# Patient Record
Sex: Female | Born: 1955 | Race: White | Hispanic: No | Smoking: Former smoker
Health system: Southern US, Community
[De-identification: ages and names within clinical notes are randomized; demographics above are authoritative.]

## PROBLEM LIST (undated history)

## (undated) DIAGNOSIS — D649 Anemia, unspecified: Secondary | ICD-10-CM

## (undated) DIAGNOSIS — E119 Type 2 diabetes mellitus without complications: Secondary | ICD-10-CM

## (undated) DIAGNOSIS — M869 Osteomyelitis, unspecified: Secondary | ICD-10-CM

## (undated) HISTORY — DX: Osteomyelitis, unspecified: M86.9

## (undated) HISTORY — DX: Anemia, unspecified: D64.9

## (undated) HISTORY — DX: Type 2 diabetes mellitus without complications: E11.9

## (undated) HISTORY — PX: OTHER SURGICAL HISTORY: SHX169

## (undated) HISTORY — PX: SKIN GRAFT: SHX250

---

## 2016-12-11 ENCOUNTER — Emergency Department (HOSPITAL_COMMUNITY): Payer: BLUE CROSS/BLUE SHIELD

## 2016-12-11 ENCOUNTER — Inpatient Hospital Stay (HOSPITAL_COMMUNITY)
Admission: EM | Admit: 2016-12-11 | Discharge: 2016-12-17 | DRG: 300 | Disposition: A | Discharge status: Home / Self Care | Payer: BLUE CROSS/BLUE SHIELD | Attending: Internal Medicine | Admitting: Internal Medicine

## 2016-12-11 ENCOUNTER — Encounter (HOSPITAL_COMMUNITY): Payer: Self-pay | Admitting: Family Medicine

## 2016-12-11 DIAGNOSIS — E872 Acidosis: Secondary | ICD-10-CM | POA: Diagnosis not present

## 2016-12-11 DIAGNOSIS — Z79899 Other long term (current) drug therapy: Secondary | ICD-10-CM

## 2016-12-11 DIAGNOSIS — R739 Hyperglycemia, unspecified: Secondary | ICD-10-CM | POA: Diagnosis not present

## 2016-12-11 DIAGNOSIS — Z7189 Other specified counseling: Secondary | ICD-10-CM | POA: Diagnosis not present

## 2016-12-11 DIAGNOSIS — E1169 Type 2 diabetes mellitus with other specified complication: Secondary | ICD-10-CM | POA: Diagnosis present

## 2016-12-11 DIAGNOSIS — D649 Anemia, unspecified: Secondary | ICD-10-CM | POA: Diagnosis present

## 2016-12-11 DIAGNOSIS — M869 Osteomyelitis, unspecified: Secondary | ICD-10-CM

## 2016-12-11 DIAGNOSIS — R5382 Chronic fatigue, unspecified: Secondary | ICD-10-CM | POA: Diagnosis present

## 2016-12-11 DIAGNOSIS — Z794 Long term (current) use of insulin: Secondary | ICD-10-CM | POA: Diagnosis not present

## 2016-12-11 DIAGNOSIS — Z515 Encounter for palliative care: Secondary | ICD-10-CM | POA: Diagnosis present

## 2016-12-11 DIAGNOSIS — E1152 Type 2 diabetes mellitus with diabetic peripheral angiopathy with gangrene: Principal | ICD-10-CM | POA: Diagnosis present

## 2016-12-11 DIAGNOSIS — Z87891 Personal history of nicotine dependence: Secondary | ICD-10-CM | POA: Diagnosis not present

## 2016-12-11 DIAGNOSIS — E860 Dehydration: Secondary | ICD-10-CM

## 2016-12-11 DIAGNOSIS — E871 Hypo-osmolality and hyponatremia: Secondary | ICD-10-CM

## 2016-12-11 DIAGNOSIS — E0810 Diabetes mellitus due to underlying condition with ketoacidosis without coma: Secondary | ICD-10-CM | POA: Diagnosis not present

## 2016-12-11 DIAGNOSIS — I96 Gangrene, not elsewhere classified: Secondary | ICD-10-CM | POA: Diagnosis not present

## 2016-12-11 DIAGNOSIS — E11621 Type 2 diabetes mellitus with foot ulcer: Secondary | ICD-10-CM | POA: Diagnosis present

## 2016-12-11 DIAGNOSIS — E46 Unspecified protein-calorie malnutrition: Secondary | ICD-10-CM | POA: Diagnosis present

## 2016-12-11 DIAGNOSIS — Z66 Do not resuscitate: Secondary | ICD-10-CM | POA: Diagnosis present

## 2016-12-11 DIAGNOSIS — R7881 Bacteremia: Secondary | ICD-10-CM | POA: Diagnosis not present

## 2016-12-11 DIAGNOSIS — F4321 Adjustment disorder with depressed mood: Secondary | ICD-10-CM | POA: Diagnosis present

## 2016-12-11 DIAGNOSIS — L97509 Non-pressure chronic ulcer of other part of unspecified foot with unspecified severity: Secondary | ICD-10-CM | POA: Diagnosis present

## 2016-12-11 DIAGNOSIS — E1110 Type 2 diabetes mellitus with ketoacidosis without coma: Secondary | ICD-10-CM | POA: Diagnosis present

## 2016-12-11 DIAGNOSIS — E8729 Other acidosis: Secondary | ICD-10-CM

## 2016-12-11 DIAGNOSIS — E222 Syndrome of inappropriate secretion of antidiuretic hormone: Secondary | ICD-10-CM | POA: Diagnosis present

## 2016-12-11 DIAGNOSIS — Z9109 Other allergy status, other than to drugs and biological substances: Secondary | ICD-10-CM | POA: Diagnosis not present

## 2016-12-11 DIAGNOSIS — E1165 Type 2 diabetes mellitus with hyperglycemia: Secondary | ICD-10-CM | POA: Diagnosis present

## 2016-12-11 DIAGNOSIS — M86171 Other acute osteomyelitis, right ankle and foot: Secondary | ICD-10-CM

## 2016-12-11 DIAGNOSIS — F1310 Sedative, hypnotic or anxiolytic abuse, uncomplicated: Secondary | ICD-10-CM | POA: Diagnosis not present

## 2016-12-11 LAB — COMPREHENSIVE METABOLIC PANEL
ALBUMIN: 1.8 g/dL — AB (ref 3.5–5.0)
ALK PHOS: 149 U/L — AB (ref 38–126)
ALT: 39 U/L (ref 14–54)
AST: 29 U/L (ref 15–41)
Anion gap: 16 — ABNORMAL HIGH (ref 5–15)
BUN: 18 mg/dL (ref 6–20)
CO2: 20 mmol/L — ABNORMAL LOW (ref 22–32)
CREATININE: 1.07 mg/dL — AB (ref 0.44–1.00)
Calcium: 9.2 mg/dL (ref 8.9–10.3)
Chloride: 92 mmol/L — ABNORMAL LOW (ref 101–111)
GFR calc non Af Amer: 55 mL/min — ABNORMAL LOW (ref >60)
Glucose, Bld: 305 mg/dL — ABNORMAL HIGH (ref 65–99)
Potassium: 4 mmol/L (ref 3.5–5.1)
Sodium: 128 mmol/L — ABNORMAL LOW (ref 135–145)
TOTAL PROTEIN: 7.4 g/dL (ref 6.5–8.1)
Total Bilirubin: 0.9 mg/dL (ref 0.3–1.2)

## 2016-12-11 LAB — CBC WITH DIFFERENTIAL/PLATELET
BASOS ABS: 0 10*3/uL (ref 0.0–0.1)
BASOS PCT: 0 %
EOS ABS: 0 10*3/uL (ref 0.0–0.7)
Eosinophils Relative: 0 %
HCT: 33.7 % — ABNORMAL LOW (ref 36.0–46.0)
HEMOGLOBIN: 11 g/dL — AB (ref 12.0–15.0)
Lymphocytes Relative: 11 %
Lymphs Abs: 1.8 10*3/uL (ref 0.7–4.0)
MCH: 26.8 pg (ref 26.0–34.0)
MCHC: 32.6 g/dL (ref 30.0–36.0)
MCV: 82.2 fL (ref 78.0–100.0)
Monocytes Absolute: 0.7 10*3/uL (ref 0.1–1.0)
Monocytes Relative: 4 %
Neutro Abs: 13.2 10*3/uL — ABNORMAL HIGH (ref 1.7–7.7)
Neutrophils Relative %: 85 %
Platelets: 406 10*3/uL — ABNORMAL HIGH (ref 150–400)
RBC: 4.1 MIL/uL (ref 3.87–5.11)
RDW: 14.1 % (ref 11.5–15.5)
WBC: 15.7 10*3/uL — ABNORMAL HIGH (ref 4.0–10.5)

## 2016-12-11 LAB — BASIC METABOLIC PANEL
Anion gap: 10 (ref 5–15)
Anion gap: 11 (ref 5–15)
BUN: 15 mg/dL (ref 6–20)
BUN: 15 mg/dL (ref 6–20)
CHLORIDE: 100 mmol/L — AB (ref 101–111)
CO2: 19 mmol/L — ABNORMAL LOW (ref 22–32)
CO2: 21 mmol/L — ABNORMAL LOW (ref 22–32)
CREATININE: 0.83 mg/dL (ref 0.44–1.00)
CREATININE: 0.83 mg/dL (ref 0.44–1.00)
Calcium: 8.3 mg/dL — ABNORMAL LOW (ref 8.9–10.3)
Calcium: 8.5 mg/dL — ABNORMAL LOW (ref 8.9–10.3)
Chloride: 100 mmol/L — ABNORMAL LOW (ref 101–111)
GFR calc Af Amer: >60 mL/min (ref >60)
GFR calc Af Amer: >60 mL/min (ref >60)
GFR calc non Af Amer: >60 mL/min (ref >60)
GLUCOSE: 200 mg/dL — AB (ref 65–99)
GLUCOSE: 202 mg/dL — AB (ref 65–99)
POTASSIUM: 3.5 mmol/L (ref 3.5–5.1)
POTASSIUM: 3.5 mmol/L (ref 3.5–5.1)
Sodium: 130 mmol/L — ABNORMAL LOW (ref 135–145)
Sodium: 131 mmol/L — ABNORMAL LOW (ref 135–145)

## 2016-12-11 LAB — MRSA PCR SCREENING: MRSA BY PCR: NEGATIVE

## 2016-12-11 LAB — GLUCOSE, CAPILLARY
Glucose-Capillary: 185 mg/dL — ABNORMAL HIGH (ref 65–99)
Glucose-Capillary: 210 mg/dL — ABNORMAL HIGH (ref 65–99)
Glucose-Capillary: 218 mg/dL — ABNORMAL HIGH (ref 65–99)

## 2016-12-11 LAB — I-STAT CG4 LACTIC ACID, ED: LACTIC ACID, VENOUS: 1.5 mmol/L (ref 0.5–1.9)

## 2016-12-11 LAB — BETA-HYDROXYBUTYRIC ACID: Beta-Hydroxybutyric Acid: 0.97 mmol/L — ABNORMAL HIGH (ref 0.05–0.27)

## 2016-12-11 LAB — CBG MONITORING, ED
GLUCOSE-CAPILLARY: 226 mg/dL — AB (ref 65–99)
Glucose-Capillary: 285 mg/dL — ABNORMAL HIGH (ref 65–99)

## 2016-12-11 MED ORDER — TETANUS-DIPHTH-ACELL PERTUSSIS 5-2.5-18.5 LF-MCG/0.5 IM SUSP
0.5000 mL | Freq: Once | INTRAMUSCULAR | Status: AC
  Administered 2016-12-11: 0.5 mL via INTRAMUSCULAR
  Filled 2016-12-11: qty 0.5

## 2016-12-11 MED ORDER — ACETAMINOPHEN 325 MG PO TABS: 650.0000 mg | ORAL_TABLET | Freq: Four times a day (QID) | ORAL | Status: DC | PRN

## 2016-12-11 MED ORDER — SODIUM CHLORIDE 0.9 % IV BOLUS (SEPSIS): 1000.0000 mL | Freq: Once | INTRAVENOUS | Status: DC

## 2016-12-11 MED ORDER — VANCOMYCIN HCL 10 G IV SOLR
1250.0000 mg | Freq: Once | INTRAVENOUS | Status: AC
  Administered 2016-12-11: 1250 mg via INTRAVENOUS
  Filled 2016-12-11: qty 1250

## 2016-12-11 MED ORDER — VANCOMYCIN HCL IN DEXTROSE 1-5 GM/200ML-% IV SOLN: 1000.0000 mg | Freq: Two times a day (BID) | INTRAVENOUS | Status: DC

## 2016-12-11 MED ORDER — SODIUM CHLORIDE 0.9 % IV SOLN
INTRAVENOUS | Status: AC
  Administered 2016-12-11: 19:00:00 via INTRAVENOUS

## 2016-12-11 MED ORDER — POTASSIUM CHLORIDE 10 MEQ/100ML IV SOLN
10.0000 meq | INTRAVENOUS | Status: AC
  Administered 2016-12-11: 10 meq via INTRAVENOUS
  Filled 2016-12-11 (×2): qty 100

## 2016-12-11 MED ORDER — DEXTROSE-NACL 5-0.45 % IV SOLN
INTRAVENOUS | Status: DC
  Administered 2016-12-11: 19:00:00 via INTRAVENOUS
  Administered 2016-12-12: 100 mL/h via INTRAVENOUS

## 2016-12-11 MED ORDER — VANCOMYCIN HCL IN DEXTROSE 1-5 GM/200ML-% IV SOLN
1000.0000 mg | Freq: Two times a day (BID) | INTRAVENOUS | Status: DC
  Administered 2016-12-12 – 2016-12-14 (×5): 1000 mg via INTRAVENOUS
  Filled 2016-12-11 (×6): qty 200

## 2016-12-11 MED ORDER — METRONIDAZOLE IN NACL 5-0.79 MG/ML-% IV SOLN
500.0000 mg | Freq: Three times a day (TID) | INTRAVENOUS | Status: DC
  Administered 2016-12-12 – 2016-12-14 (×8): 500 mg via INTRAVENOUS
  Filled 2016-12-11 (×9): qty 100

## 2016-12-11 MED ORDER — PIPERACILLIN-TAZOBACTAM 3.375 G IVPB 30 MIN
3.3750 g | Freq: Once | INTRAVENOUS | Status: AC
  Administered 2016-12-11: 3.375 g via INTRAVENOUS
  Filled 2016-12-11: qty 50

## 2016-12-11 MED ORDER — SODIUM CHLORIDE 0.9 % IV SOLN
INTRAVENOUS | Status: DC
  Administered 2016-12-11: 1.7 [IU]/h via INTRAVENOUS
  Filled 2016-12-11: qty 1

## 2016-12-11 MED ORDER — INSULIN ASPART 100 UNIT/ML ~~LOC~~ SOLN: 0.0000 [IU] | SUBCUTANEOUS | Status: DC

## 2016-12-11 MED ORDER — SODIUM CHLORIDE 0.9 % IV SOLN: INTRAVENOUS | Status: DC

## 2016-12-11 MED ORDER — PIPERACILLIN-TAZOBACTAM 3.375 G IVPB: 3.3750 g | Freq: Three times a day (TID) | INTRAVENOUS | Status: DC

## 2016-12-11 MED ORDER — DEXTROSE 5 % IV SOLN
1.0000 g | Freq: Three times a day (TID) | INTRAVENOUS | Status: DC
  Administered 2016-12-11 – 2016-12-14 (×8): 1 g via INTRAVENOUS
  Filled 2016-12-11 (×10): qty 1

## 2016-12-11 MED ORDER — ACETAMINOPHEN 650 MG RE SUPP: 650.0000 mg | Freq: Four times a day (QID) | RECTAL | Status: DC | PRN

## 2016-12-11 MED ORDER — SODIUM CHLORIDE 0.9 % IV BOLUS (SEPSIS)
1000.0000 mL | Freq: Once | INTRAVENOUS | Status: AC
  Administered 2016-12-11: 1000 mL via INTRAVENOUS

## 2016-12-11 NOTE — Progress Notes (Deleted)
Pharmacy Antibiotic Note  Brenda Moore is a 60 y.o. female admitted on 12/11/2016 with necrotic bilateral feet.  Pharmacy has been consulted for vancomycin and Zosyn dosing.   -SCr= 1.07 and CrCl ~ 60   Plan: - Continue with Vanc 750mg  IV q12h and Zosyn 3.375gm IV q8h -Will follow renal function, cultures and clinical progress     Height: 5\' 10"  (177.8 cm) Weight: 160 lb (72.6 kg) IBW/kg (Calculated) : 68.5  Temp (24hrs), Avg:97.9 F (36.6 C), Min:97.9 F (36.6 C), Max:97.9 F (36.6 C)   Recent Labs Lab 12/11/16 1349 12/11/16 1429  WBC  --  15.7*  CREATININE  --  1.07*  LATICACIDVEN 1.50  --     Estimated Creatinine Clearance: 60.5 mL/min (A) (by C-G formula based on SCr of 1.07 mg/dL (H)).    No Known Allergies   Vanc 5/19 >> Zosyn 5/19 >>  5/19 BCx -   Hildred Laser, Sherian Rein D 12/11/2016 4:29 PM

## 2016-12-11 NOTE — H&P (Signed)
History and Physical    Brenda Moore XFG:[id number] DOB: 07-17-1956 DOA: 12/11/2016  PCP: Patient, No Pcp Per Patient coming from: Home  Chief Complaint: Foot infection  HPI: Brenda Moore is a 61 y.o. female with no significant medical history. About 2 months ago, patient noticed that she was weeping blood from her right foot. She was concerned that there is possibly some kind of "impurity" and decided to try Epsom salt to "draw out the impurities." Over time, she started noticing some purulent drainage emanating from her wound. She reports associated pain and was using Tylenol to help. She did not seek medical attention because she thought it would go away on its own.   ED Course: Vitals: Afebrile. Normal pulse. Normotensive. 97% on room air. Labs: Sodium of 128, glucose of 305, CO2 of 20, creatinine of 1.07, alkaline phosphatase of 149, albumin of 1.8, WBC of 15.7 K, hemoglobin of 11. Imaging: Foot x-ray significant for evidence of gas gangrene and osteomyelitis Medications/Course: Vancomycin, Zosyn, Tdap  Review of Systems: Review of Systems  Constitutional: Positive for chills and malaise/fatigue. Negative for fever.  Respiratory: Negative for cough, shortness of breath and wheezing.   Cardiovascular: Negative for chest pain and palpitations.  Neurological: Positive for weakness.  Psychiatric/Behavioral: Negative for depression, memory loss, substance abuse and suicidal ideas. The patient does not have insomnia.   All other systems reviewed and are negative.   History reviewed. No pertinent past medical history.  History reviewed. No pertinent surgical history.   reports that she has quit smoking. She has a 30.00 pack-year smoking history. She has never used smokeless tobacco. She reports that she does not drink alcohol or use drugs.  Allergies  Allergen Reactions  . Poison Oak Extract Rash    Family History  Problem Relation Age of Onset  . Lymphoma Mother   . Heart disease  Mother   . Prostate cancer Father   . Skin cancer Father   . Stroke Father   . Seizures Father   . Epilepsy Brother   . Prostate cancer Brother     Prior to Admission medications   Medication Sig Start Date End Date Taking? Authorizing Provider  Multiple Vitamins-Minerals (MULTIVITAMIN WITH MINERALS) tablet Take 1 tablet by mouth daily.   Yes [provider]    Physical Exam: Vitals:   12/11/16 1545 12/11/16 1600 12/11/16 1615 12/11/16 1630  BP: 112/65 106/61 111/61 (!) 105/52  Pulse: 85 85 84 85  Resp:      Temp:      TempSrc:      SpO2: 96% 92% 95% 97%  Weight:      Height:         Constitutional: NAD, calm, comfortable, disheveled.  Eyes: PERRL, lids and conjunctivae normal ENMT: Mucous membranes are slightly moist. Posterior pharynx clear of any exudate or lesions. Poor dentition.  Neck: normal, supple, no masses, no thyromegaly Respiratory: clear to auscultation bilaterally, no wheezing, no crackles. Normal respiratory effort. No accessory muscle use.  Cardiovascular: Regular rate and rhythm, no murmurs / rubs / gallops. No extremity edema. trace+ pedal pulses. No carotid bruits.  Abdomen: no tenderness, no masses palpated. No hepatosplenomegaly. Bowel sounds positive.  Musculoskeletal: no clubbing / cyanosis. No joint deformity upper and lower extremities. Good ROM, no contractures. Normal muscle tone.  Skin: Gangrene of multiple toes of right foot with crusting on plantar surface of bilateral feet. Large draining wound on right medial malleolar area with surrounding erythema along the leg. Neurologic: CN 2-12  grossly intact. Sensation intact, DTR normal. Strength 5/5 in all 4.  Psychiatric: Poor judgment and insight. Alert and oriented x 3. Depressed mood. Flat affect.             Labs on Admission: I have personally reviewed following labs and imaging studies  CBC:  Recent Labs Lab 12/11/16 1429  WBC 15.7*  NEUTROABS 13.2*  HGB 11.0*  HCT  33.7*  MCV 82.2  PLT 081*   Basic Metabolic Panel:  Recent Labs Lab 12/11/16 1429  NA 128*  K 4.0  CL 92*  CO2 20*  GLUCOSE 305*  BUN 18  CREATININE 1.07*  CALCIUM 9.2   GFR: Estimated Creatinine Clearance: 60.5 mL/min (A) (by C-G formula based on SCr of 1.07 mg/dL (H)). Liver Function Tests:  Recent Labs Lab 12/11/16 1429  AST 29  ALT 39  ALKPHOS 149*  BILITOT 0.9  PROT 7.4  ALBUMIN 1.8*   Radiological Exams on Admission: Dg Foot Complete Right  Result Date: 12/11/2016 CLINICAL DATA:  Necrosis of bilateral feet. EXAM: RIGHT FOOT COMPLETE - 3+ VIEW COMPARISON:  None. FINDINGS: Significant soft tissue gas identified overlying the calcaneus, the lateral midfoot, and numerous toes. There is significant bony destruction associated with the first proximal and distal phalanges. Involvement of the adjacent first metatarsal is also identified. The second through fourth toes and second through fourth metatarsals demonstrate no fracture or bony erosion. The tarsal bones are normal. Lucency along the posterosuperior calcaneus is suspicious for osteomyelitis based on the lateral view. An MRI could better assess the extent of osteomyelitis. IMPRESSION: 1. Diffuse soft tissue gas consistent with a gas-forming organism. 2. Significant destruction associated with the great toe and adjacent first metatarsal consistent with osteomyelitis. 3. Suspected osteomyelitis along the posterosuperior aspect of the calcaneus. An MRI could better assess the extent of osteomyelitis. Electronically Signed   By: Dorise Bullion III M.D   On: 12/11/2016 14:29    EKG: None obtained  Assessment/Plan Principal Problem:   Gangrene ([place]) Active Problems:   DKA (diabetic ketoacidoses) ([place])   Osteomyelitis ([place])   High anion gap metabolic acidosis   Gangrene Diabetic foot ulcer Osteomyelitis Anticipate patient will require amputation of right foot. Orthopedic surgery as are to been consulted and will  likely take patient to the OR tomorrow morning. -Continue empiric vancomycin, cefepime, metronidazole -nutrition consult -ABIs  Diabetic ketoacidosis Diabetes mellitus High anion gap metabolic acidosis No diagnosis of diabetes prior to admission. Patient meets criteria for diabetes with random glucose of 305 and low CO2 suggesting acidosis. Anion gap is elevated at 16. -Insulin drip protocol -IV fluid bolus, followed by continuous IV fluids -Hemoglobin A1c -beta-hydroxybutyric acid -urinalysis pending -diabetic coordinator  Depressed mood Patient denies symptoms of depressed mood or history of depression. On evaluation, patient appears to be having depressed mood. -Psychiatry called for consult at 6:32PM   DVT prophylaxis: SCDs Code Status: DNR Family Communication: son, daughter and son-in-law Disposition Plan: Discharge pending management of osteomyelitis Consults called: Dr. Marlou Sa, Orthopedic surgery, by EDP Admission status: Inpatient, medical floor   Cordelia Poche, MD [facility] Pager [phone]  If 7PM-7AM, please contact night-coverage [email or web address] Password [facility]  12/11/2016, 5:24 PM

## 2016-12-11 NOTE — Consult Note (Signed)
Reason for Consult:right foot gangrene Referring Physician: Dr [name] is an 61 y.o. female.  HPI: Brenda Moore is a 61 year old female who lives alone who describes several month history of malaise and progressive right foot infection.  She was urged to come here today by her daughter and son-in-law.  Extensive discussion with these 2 people demonstrate that this process is been ongoing for 3-4 months.  Patient is not particularly interested in any type of surgical intervention.  She states that she is okay with this infection being the terminal event of her life.  She otherwise is calm and appropriately answers questions.  She has not seen a physician in some time.  She has been diagnosed with uncontrolled diabetes.  She does describe having malaise over the past 2 months.  She denies any other joint complaints. History reviewed. No pertinent past medical history.  History reviewed. No pertinent surgical history.  Family History  Problem Relation Age of Onset  . Lymphoma Mother   . Heart disease Mother   . Prostate cancer Father   . Skin cancer Father   . Stroke Father   . Seizures Father   . Epilepsy Brother   . Prostate cancer Brother     Social History:  reports that she has quit smoking. She has a 30.00 pack-year smoking history. She has never used smokeless tobacco. She reports that she does not drink alcohol or use drugs.  Allergies:  Allergies  Allergen Reactions  . Poison Oak Extract Rash    Medications: I have reviewed the patient's current medications.  Results for orders placed or performed during the hospital encounter of 12/11/16 (from the past 48 hour(s))  I-Stat CG4 Lactic Acid, ED     Status: None   Collection Time: 12/11/16  1:49 PM  Result Value Ref Range   Lactic Acid, Venous 1.50 0.5 - 1.9 mmol/L  CBC with Differential     Status: Abnormal   Collection Time: 12/11/16  2:29 PM  Result Value Ref Range   WBC 15.7 (H) 4.0 - 10.5 K/uL   RBC 4.10 3.87 -  5.11 MIL/uL   Hemoglobin 11.0 (L) 12.0 - 15.0 g/dL   HCT 33.7 (L) 36.0 - 46.0 %   MCV 82.2 78.0 - 100.0 fL   MCH 26.8 26.0 - 34.0 pg   MCHC 32.6 30.0 - 36.0 g/dL   RDW 14.1 11.5 - 15.5 %   Platelets 406 (H) 150 - 400 K/uL   Neutrophils Relative % 85 %   Neutro Abs 13.2 (H) 1.7 - 7.7 K/uL   Lymphocytes Relative 11 %   Lymphs Abs 1.8 0.7 - 4.0 K/uL   Monocytes Relative 4 %   Monocytes Absolute 0.7 0.1 - 1.0 K/uL   Eosinophils Relative 0 %   Eosinophils Absolute 0.0 0.0 - 0.7 K/uL   Basophils Relative 0 %   Basophils Absolute 0.0 0.0 - 0.1 K/uL  Comprehensive metabolic panel     Status: Abnormal   Collection Time: 12/11/16  2:29 PM  Result Value Ref Range   Sodium 128 (L) 135 - 145 mmol/L   Potassium 4.0 3.5 - 5.1 mmol/L   Chloride 92 (L) 101 - 111 mmol/L   CO2 20 (L) 22 - 32 mmol/L   Glucose, Bld 305 (H) 65 - 99 mg/dL   BUN 18 6 - 20 mg/dL   Creatinine, Ser 1.07 (H) 0.44 - 1.00 mg/dL   Calcium 9.2 8.9 - 10.3 mg/dL   Total Protein 7.4 6.5 -  8.1 g/dL   Albumin 1.8 (L) 3.5 - 5.0 g/dL   AST 29 15 - 41 U/L   ALT 39 14 - 54 U/L   Alkaline Phosphatase 149 (H) 38 - 126 U/L   Total Bilirubin 0.9 0.3 - 1.2 mg/dL   GFR calc non Af Amer 55 (L) >60 mL/min   GFR calc Af Amer >60 >60 mL/min    Comment: (NOTE) The eGFR has been calculated using the CKD EPI equation. This calculation has not been validated in all clinical situations. eGFR's persistently <60 mL/min signify possible Chronic Kidney Disease.    Anion gap 16 (H) 5 - 15  CBG monitoring, ED     Status: Abnormal   Collection Time: 12/11/16  6:34 PM  Result Value Ref Range   Glucose-Capillary 226 (H) 65 - 99 mg/dL    Dg Foot Complete Right  Result Date: 12/11/2016 CLINICAL DATA:  Necrosis of bilateral feet. EXAM: RIGHT FOOT COMPLETE - 3+ VIEW COMPARISON:  None. FINDINGS: Significant soft tissue gas identified overlying the calcaneus, the lateral midfoot, and numerous toes. There is significant bony destruction associated with  the first proximal and distal phalanges. Involvement of the adjacent first metatarsal is also identified. The second through fourth toes and second through fourth metatarsals demonstrate no fracture or bony erosion. The tarsal bones are normal. Lucency along the posterosuperior calcaneus is suspicious for osteomyelitis based on the lateral view. An MRI could better assess the extent of osteomyelitis. IMPRESSION: 1. Diffuse soft tissue gas consistent with a gas-forming organism. 2. Significant destruction associated with the great toe and adjacent first metatarsal consistent with osteomyelitis. 3. Suspected osteomyelitis along the posterosuperior aspect of the calcaneus. An MRI could better assess the extent of osteomyelitis. Electronically Signed   By: Dorise Bullion III M.D   On: 12/11/2016 14:29    Review of Systems  Constitutional: Positive for fever and malaise/fatigue.  Musculoskeletal: Positive for joint pain.   Blood pressure 95/72, pulse 78, temperature 97.9 F (36.6 C), temperature source Oral, resp. rate 19, height 5' 10"  (1.778 m), weight 160 lb (72.6 kg), SpO2 92 %. Physical Exam  HENT:  Head: Normocephalic.  Eyes: Pupils are equal, round, and reactive to light.  Neck: Normal range of motion.  Cardiovascular: Normal rate.   Respiratory: Effort normal.  Neurological: She is alert.  Skin: Skin is warm.   examination the right foot demonstrates gangrene affecting the toes.  There is swelling of the forefoot.  Eschar is present on the plantar surface of the foot.  There is draining wound on the medial aspect of the calcaneus.  This does probe to bone.  There is no tissue crepitus in the calf compartment posteriorly or anterior compartment.  Some erythema is present but it stays below the malleolar axis.  Left foot is also examined.  Pedal pulses absent in both feet.  Plantar eschar is present on the left foot as well but there does not appear to be any active erythema or infection in the  left foot.  Diminished sensation is present in standard stocking glove pattern.  Assessment/Plan: Impression is right foot gangrenous infection with a anaerobic component.  Calcaneus is involved.  Her only treatment option at this time would be below-the-knee amputation.  This may or may not heal based on her uncontrolled diabetes.  Other option would be medical treatment for whatever time.  He is deemed appropriate followed by hospice care.  It is explained at length to the patient that her option is  either surgical amputation or eventual sepsis and death.  I also have concern for the left foot.  The benefits of prosthetic ambulation are discussed.  The patient and her family have significant issues to work through regarding treatment plans.  She will be admitted to the medical service for initial antibody treatment.  She may or may not change her mind about agreeing to amputation.  For now she's fine to undergo medical treatments.  We will revisit the issue and 1-2 days.  I'll likely have my partner Dr. Sharol Given consult on her as well.  He could potentially perform the surgery next week if she is agreeable.  Landry Dyke Harrell Niehoff 12/11/2016, 7:46 PM

## 2016-12-11 NOTE — ED Notes (Signed)
Report attempted x 1

## 2016-12-11 NOTE — ED Provider Notes (Signed)
[facility] DEPT Provider Note   CSN: [id number] Arrival date & time: 12/11/16  1245     History   Chief Complaint Chief Complaint  Patient presents with  . Foot necrosis    HPI Brenda Moore is a 61 y.o. female who presents with bilateral wounds to her feet. No known significant past medical history. The patient states that she is felt fatigued, had generalized malaise, and anorexia since January. She may have had fevers as well but is unsure. She has worked at [employer] for the past 14 years and states that she has had calluses on her feet for "awhile". Over the past 2 months the calluses have worsened and her feet have become black, malodorous, and has been draining. She has been soaking them in epsom salts and putting hydrocortisone on them. She denies pain in the feet but reports tightness in her right foot which she attributes to swelling. She denies chills, headache, chest pain, shortness of breath, abdominal pain, nausea, vomiting, diarrhea, dysuria. She denies depressive symptoms but admits she is in denial of her symptoms because she has always been healthy. She denies any prior medical history or being on any medicines. She does not have a PCP.  HPI  No past medical history on file.  There are no active problems to display for this patient.   No past surgical history on file.  OB History    No data available       Home Medications    Prior to Admission medications   Not on File    Family History No family history on file.  Social History Social History  Substance Use Topics  . Smoking status: Not on file  . Smokeless tobacco: Not on file  . Alcohol use Not on file     Allergies   Patient has no known allergies.   Review of Systems Review of Systems  Constitutional: Positive for activity change, appetite change, fatigue, fever and unexpected weight change. Negative for chills.  Respiratory: Negative for shortness of breath.   Cardiovascular: Negative  for chest pain.  Gastrointestinal: Negative for abdominal pain, constipation, diarrhea, nausea and vomiting.  Genitourinary: Negative for dysuria and hematuria.  Musculoskeletal: Negative for gait problem.  Skin: Positive for wound.  Neurological: Negative for headaches.  Psychiatric/Behavioral: Negative for dysphoric mood and self-injury.  All other systems reviewed and are negative.    Physical Exam Updated Vital Signs BP 124/78 (BP Location: Right Arm)   Pulse 89   Temp 97.9 F (36.6 C) (Oral)   Resp 19   SpO2 98%   Physical Exam  Constitutional: She is oriented to person, place, and time. She appears well-developed and well-nourished. No distress.  Calm, cooperative  HENT:  Head: Normocephalic and atraumatic.  Dry mucous membranes  Eyes: Conjunctivae are normal. Pupils are equal, round, and reactive to light. Right eye exhibits no discharge. Left eye exhibits no discharge. No scleral icterus.  Neck: Normal range of motion.  Cardiovascular: Normal rate and regular rhythm.  Exam reveals no gallop and no friction rub.   No murmur heard. Pulmonary/Chest: Effort normal and breath sounds normal. No respiratory distress. She has no wheezes. She has no rales. She exhibits no tenderness.  Abdominal: Soft. Bowel sounds are normal. She exhibits no distension and no mass. There is no tenderness. There is no rebound and no guarding. No hernia.  Musculoskeletal: She exhibits edema.  Right foot: Malodorous necrotic foot with swelling and skin changes. Pulse is not palpable likely due  to swelling. See picture for details  Left foot: Necrosis of foot noted as well but not as severe as right  Neurological: She is alert and oriented to person, place, and time.  Skin: Skin is warm and dry.  Psychiatric: Her speech is normal. Her affect is blunt. She is withdrawn. She exhibits a depressed mood.  Nursing note and vitals reviewed.          ED Treatments / Results  Labs (all labs  ordered are listed, but only abnormal results are displayed) Labs Reviewed  CBC WITH DIFFERENTIAL/PLATELET - Abnormal; Notable for the following:       Result Value   WBC 15.7 (*)    Hemoglobin 11.0 (*)    HCT 33.7 (*)    Platelets 406 (*)    Neutro Abs 13.2 (*)    All other components within normal limits  COMPREHENSIVE METABOLIC PANEL - Abnormal; Notable for the following:    Sodium 128 (*)    Chloride 92 (*)    CO2 20 (*)    Glucose, Bld 305 (*)    Creatinine, Ser 1.07 (*)    Albumin 1.8 (*)    Alkaline Phosphatase 149 (*)    GFR calc non Af Amer 55 (*)    Anion gap 16 (*)    All other components within normal limits  CULTURE, BLOOD (ROUTINE X 2)  CULTURE, BLOOD (ROUTINE X 2)  I-STAT CG4 LACTIC ACID, ED    EKG  EKG Interpretation None       Radiology Dg Foot Complete Right  Result Date: 12/11/2016 CLINICAL DATA:  Necrosis of bilateral feet. EXAM: RIGHT FOOT COMPLETE - 3+ VIEW COMPARISON:  None. FINDINGS: Significant soft tissue gas identified overlying the calcaneus, the lateral midfoot, and numerous toes. There is significant bony destruction associated with the first proximal and distal phalanges. Involvement of the adjacent first metatarsal is also identified. The second through fourth toes and second through fourth metatarsals demonstrate no fracture or bony erosion. The tarsal bones are normal. Lucency along the posterosuperior calcaneus is suspicious for osteomyelitis based on the lateral view. An MRI could better assess the extent of osteomyelitis. IMPRESSION: 1. Diffuse soft tissue gas consistent with a gas-forming organism. 2. Significant destruction associated with the great toe and adjacent first metatarsal consistent with osteomyelitis. 3. Suspected osteomyelitis along the posterosuperior aspect of the calcaneus. An MRI could better assess the extent of osteomyelitis. Electronically Signed   By: Dorise Bullion III M.D   On: 12/11/2016 14:29     Procedures Procedures (including critical care time)  Medications Ordered in ED Medications  vancomycin (VANCOCIN) 1,250 mg in sodium chloride 0.9 % 250 mL IVPB (1,250 mg Intravenous New Bag/Given 12/11/16 1526)  vancomycin (VANCOCIN) IVPB 1000 mg/200 mL premix (not administered)  piperacillin-tazobactam (ZOSYN) IVPB 3.375 g (not administered)  piperacillin-tazobactam (ZOSYN) IVPB 3.375 g (0 g Intravenous Stopped 12/11/16 1556)  Tdap (BOOSTRIX) injection 0.5 mL (0.5 mLs Intramuscular Given 12/11/16 1529)  sodium chloride 0.9 % bolus 1,000 mL (1,000 mLs Intravenous New Bag/Given 12/11/16 1529)    Initial Impression / Assessment and Plan / ED Course  I have reviewed the triage vital signs and the nursing notes.  Pertinent labs & imaging results that were available during my care of the patient were reviewed by me and considered in my medical decision making (see chart for details).  61 year old female with necrotic wounds of her bilateral feet with evidence of osteomyelitis. Vital signs are normal. CBC remarkable for leukocytosis of  15.7 and mild anemia. CMP remarkable for hyponatremia, hypochloremia, low bicarbonate, significant hyperglycemia, elevated serum creatinine, elevated anion gap, elevated alkaline phosphatase. Likely undiagnosed diabetes and a component of malnutrition which patient admits to. Lactic acid is normal. Blood cultures obtained. Vanc and Zosyn started along with fluids. Shared visit with Dr. Tamera Punt. Will consult orthopedics.  Spoke with Dr. Marlou Sa who recommends no blood thinners and to admit to hospitalist. Pt will likely lose her leg. Dr. Tamera Punt spoke with Dr. Lonny Prude who will admit.   Final Clinical Impressions(s) / ED Diagnoses   Final diagnoses:  Osteomyelitis of right foot, unspecified type ([place])  Hyponatremia  Dehydration  Protein-calorie malnutrition, unspecified severity ([place])  Hyperglycemia    New Prescriptions New Prescriptions   No medications on file      Iris Pert 12/11/16 Haskell Flirt, MD 12/12/16 [phone]

## 2016-12-11 NOTE — ED Notes (Signed)
Report attempted again.  RN stated that they cannot take patient at this time requesting 20 more min.  Charge RN and [facility] made aware of situation.

## 2016-12-11 NOTE — ED Notes (Signed)
Patient is stable and ready to be transport to the floor at this time.  Report was called to 4N RN.  Belongings taken with the patient to the floor.

## 2016-12-11 NOTE — ED Notes (Signed)
Admitting MD at bedside.

## 2016-12-11 NOTE — Progress Notes (Signed)
Pharmacy Antibiotic Note  Brenda Moore is a 61 y.o. female admitted on 12/11/2016 with necrotic bilateral feet.  Pharmacy has been consulted for vancomycin and Zosyn dosing.  Baseline labs pending.   Plan: - Vanc 1250mg  IV x 1 - Zosyn 3.375gm IV x 1 - F/U baseline labs for further dosing   Height: 5\' 10"  (177.8 cm) Weight: 160 lb (72.6 kg) IBW/kg (Calculated) : 68.5  Temp (24hrs), Avg:97.9 F (36.6 C), Min:97.9 F (36.6 C), Max:97.9 F (36.6 C)   Recent Labs Lab 12/11/16 1349 12/11/16 1429  WBC  --  15.7*  LATICACIDVEN 1.50  --     CrCl cannot be calculated (No order found.).    No Known Allergies   Vanc 5/19 >> Zosyn 5/19 >>  5/19 BCx -    Momen Ham D. Mina Marble, PharmD, BCPS Pager:  [phone] 12/11/2016, 3:07 PM

## 2016-12-11 NOTE — Progress Notes (Signed)
Pharmacy Antibiotic Note  Brenda Moore is a 61 y.o. female admitted on 12/11/2016 with necrotic bilateral feet.  Pharmacy has been consulted for vancomycin and Zosyn dosing.    Plan: Vancomycin 1g IV q12h Zosyn 3.375gm IV q8h Monitor culture data, renal function and clinical course VT at SS prn  Height: 5\' 10"  (177.8 cm) Weight: 160 lb (72.6 kg) IBW/kg (Calculated) : 68.5  Temp (24hrs), Avg:97.9 F (36.6 C), Min:97.9 F (36.6 C), Max:97.9 F (36.6 C)   Recent Labs Lab 12/11/16 1349 12/11/16 1429  WBC  --  15.7*  CREATININE  --  1.07*  LATICACIDVEN 1.50  --     Estimated Creatinine Clearance: 60.5 mL/min (A) (by C-G formula based on SCr of 1.07 mg/dL (H)).    No Known Allergies   Andrey Cota. Diona Foley, PharmD, BCPS Clinical Pharmacist [phone]  12/11/2016, 4:26 PM

## 2016-12-11 NOTE — ED Notes (Signed)
Surgery at bedside.

## 2016-12-11 NOTE — Progress Notes (Signed)
Pharmacy Antibiotic Note  Brenda Moore is a 61 y.o. female admitted on 12/11/2016 with necrotic bilateral feet.  Pharmacy originally consulted for vancomycin and Zosyn dosing then consults discontinued. Now consults reorder for Vancomycin and Cefepime. Pt also on Flagyl.   Plan: Continue Vancomycin 1g IV q12h Cefepime 1gm IV q8h Monitor culture data, renal function and clinical course VT at SS prn  Height: 5\' 10"  (177.8 cm) Weight: 148 lb 13 oz (67.5 kg) IBW/kg (Calculated) : 68.5  Temp (24hrs), Avg:98.1 F (36.7 C), Min:97.9 F (36.6 C), Max:98.3 F (36.8 C)   Recent Labs Lab 12/11/16 1349 12/11/16 1429  WBC  --  15.7*  CREATININE  --  1.07*  LATICACIDVEN 1.50  --     Estimated Creatinine Clearance: 59.6 mL/min (A) (by C-G formula based on SCr of 1.07 mg/dL (H)).    Allergies  Allergen Reactions  . [place], PharmD, BCPS Clinical pharmacist, pager [phone] 12/11/2016, 10:03 PM

## 2016-12-11 NOTE — ED Triage Notes (Signed)
Received pt from home via EMS with c/o necrosis to B/L feet ongoing for unknown amount of time. Per EMS, family reported that pt has been limping around for at least a year, but pt refused to let anyone look at her feet. Family was on vacation and hasn't seen pt in 2 weeks. Family reports pt has had significant weight loss. Pt reports worked in [employer]) for 12 years and that her feet began with callous progressing to becoming black in color. Pt presents with severe necrosis to B/L feet, large callous to B/L big toes, strong odor and drainage of pus and blood. Pt has had multiple welfare checks and visits from adult protective service visits in the past, per EMS.

## 2016-12-12 DIAGNOSIS — E46 Unspecified protein-calorie malnutrition: Secondary | ICD-10-CM

## 2016-12-12 DIAGNOSIS — E1152 Type 2 diabetes mellitus with diabetic peripheral angiopathy with gangrene: Principal | ICD-10-CM

## 2016-12-12 DIAGNOSIS — F1310 Sedative, hypnotic or anxiolytic abuse, uncomplicated: Secondary | ICD-10-CM

## 2016-12-12 DIAGNOSIS — Z9109 Other allergy status, other than to drugs and biological substances: Secondary | ICD-10-CM

## 2016-12-12 DIAGNOSIS — Z87891 Personal history of nicotine dependence: Secondary | ICD-10-CM

## 2016-12-12 DIAGNOSIS — F4321 Adjustment disorder with depressed mood: Secondary | ICD-10-CM

## 2016-12-12 LAB — BASIC METABOLIC PANEL
ANION GAP: 8 (ref 5–15)
Anion gap: 10 (ref 5–15)
Anion gap: 8 (ref 5–15)
BUN: 12 mg/dL (ref 6–20)
BUN: 11 mg/dL (ref 6–20)
BUN: 10 mg/dL (ref 6–20)
CHLORIDE: 100 mmol/L — AB (ref 101–111)
CHLORIDE: 102 mmol/L (ref 101–111)
CHLORIDE: 103 mmol/L (ref 101–111)
CO2: 22 mmol/L (ref 22–32)
CO2: 21 mmol/L — ABNORMAL LOW (ref 22–32)
CO2: 20 mmol/L — ABNORMAL LOW (ref 22–32)
CREATININE: 0.76 mg/dL (ref 0.44–1.00)
CREATININE: 0.88 mg/dL (ref 0.44–1.00)
Calcium: 8.3 mg/dL — ABNORMAL LOW (ref 8.9–10.3)
Calcium: 8.1 mg/dL — ABNORMAL LOW (ref 8.9–10.3)
Calcium: 8.2 mg/dL — ABNORMAL LOW (ref 8.9–10.3)
Creatinine, Ser: 0.71 mg/dL (ref 0.44–1.00)
GFR calc Af Amer: >60 mL/min (ref >60)
GFR calc non Af Amer: >60 mL/min (ref >60)
GFR calc non Af Amer: >60 mL/min (ref >60)
GFR calc non Af Amer: >60 mL/min (ref >60)
GLUCOSE: 247 mg/dL — AB (ref 65–99)
Glucose, Bld: 163 mg/dL — ABNORMAL HIGH (ref 65–99)
Glucose, Bld: 187 mg/dL — ABNORMAL HIGH (ref 65–99)
POTASSIUM: 3.4 mmol/L — AB (ref 3.5–5.1)
POTASSIUM: 3.2 mmol/L — AB (ref 3.5–5.1)
Potassium: 3.4 mmol/L — ABNORMAL LOW (ref 3.5–5.1)
SODIUM: 131 mmol/L — AB (ref 135–145)
Sodium: 132 mmol/L — ABNORMAL LOW (ref 135–145)
Sodium: 131 mmol/L — ABNORMAL LOW (ref 135–145)

## 2016-12-12 LAB — URINALYSIS, ROUTINE W REFLEX MICROSCOPIC
Bilirubin Urine: NEGATIVE
Glucose, UA: NEGATIVE mg/dL
Ketones, ur: NEGATIVE mg/dL
Nitrite: NEGATIVE
Protein, ur: 30 mg/dL — AB
Specific Gravity, Urine: 1.011 (ref 1.005–1.030)
pH: 5 (ref 5.0–8.0)

## 2016-12-12 LAB — GLUCOSE, CAPILLARY
GLUCOSE-CAPILLARY: 110 mg/dL — AB (ref 65–99)
GLUCOSE-CAPILLARY: 150 mg/dL — AB (ref 65–99)
GLUCOSE-CAPILLARY: 162 mg/dL — AB (ref 65–99)
GLUCOSE-CAPILLARY: 176 mg/dL — AB (ref 65–99)
GLUCOSE-CAPILLARY: 173 mg/dL — AB (ref 65–99)
GLUCOSE-CAPILLARY: 190 mg/dL — AB (ref 65–99)
GLUCOSE-CAPILLARY: 282 mg/dL — AB (ref 65–99)
Glucose-Capillary: 133 mg/dL — ABNORMAL HIGH (ref 65–99)
Glucose-Capillary: 136 mg/dL — ABNORMAL HIGH (ref 65–99)
Glucose-Capillary: 138 mg/dL — ABNORMAL HIGH (ref 65–99)
Glucose-Capillary: 138 mg/dL — ABNORMAL HIGH (ref 65–99)
Glucose-Capillary: 166 mg/dL — ABNORMAL HIGH (ref 65–99)
Glucose-Capillary: 168 mg/dL — ABNORMAL HIGH (ref 65–99)
Glucose-Capillary: 174 mg/dL — ABNORMAL HIGH (ref 65–99)
Glucose-Capillary: 192 mg/dL — ABNORMAL HIGH (ref 65–99)

## 2016-12-12 LAB — BLOOD CULTURE ID PANEL (REFLEXED)
Acinetobacter baumannii: NOT DETECTED
CANDIDA KRUSEI: NOT DETECTED
Candida albicans: NOT DETECTED
Candida glabrata: NOT DETECTED
Candida parapsilosis: NOT DETECTED
Candida tropicalis: NOT DETECTED
ENTEROCOCCUS SPECIES: NOT DETECTED
ESCHERICHIA COLI: NOT DETECTED
Enterobacter cloacae complex: NOT DETECTED
Enterobacteriaceae species: NOT DETECTED
Haemophilus influenzae: NOT DETECTED
Klebsiella oxytoca: NOT DETECTED
Klebsiella pneumoniae: NOT DETECTED
LISTERIA MONOCYTOGENES: NOT DETECTED
Methicillin resistance: DETECTED — AB
Neisseria meningitidis: NOT DETECTED
Proteus species: NOT DETECTED
Pseudomonas aeruginosa: NOT DETECTED
SERRATIA MARCESCENS: NOT DETECTED
STAPHYLOCOCCUS AUREUS BCID: NOT DETECTED
STAPHYLOCOCCUS SPECIES: DETECTED — AB
STREPTOCOCCUS AGALACTIAE: NOT DETECTED
Streptococcus pneumoniae: NOT DETECTED
Streptococcus pyogenes: NOT DETECTED
Streptococcus species: NOT DETECTED

## 2016-12-12 LAB — CBC
HEMATOCRIT: 26 % — AB (ref 36.0–46.0)
HEMOGLOBIN: 8.2 g/dL — AB (ref 12.0–15.0)
MCH: 25.6 pg — ABNORMAL LOW (ref 26.0–34.0)
MCHC: 31.5 g/dL (ref 30.0–36.0)
MCV: 81.3 fL (ref 78.0–100.0)
Platelets: 378 10*3/uL (ref 150–400)
RBC: 3.2 MIL/uL — AB (ref 3.87–5.11)
RDW: 14 % (ref 11.5–15.5)
WBC: 14.1 10*3/uL — AB (ref 4.0–10.5)

## 2016-12-12 LAB — C-REACTIVE PROTEIN: CRP: 29.7 mg/dL — ABNORMAL HIGH (ref <1.0)

## 2016-12-12 LAB — IRON AND TIBC
IRON: 16 ug/dL — AB (ref 28–170)
Saturation Ratios: 9 % — ABNORMAL LOW (ref 10.4–31.8)
TIBC: 171 ug/dL — ABNORMAL LOW (ref 250–450)
UIBC: 155 ug/dL

## 2016-12-12 LAB — PREALBUMIN

## 2016-12-12 LAB — RETICULOCYTES
RBC.: 3.53 MIL/uL — AB (ref 3.87–5.11)
RETIC COUNT ABSOLUTE: 35.3 10*3/uL (ref 19.0–186.0)
Retic Ct Pct: 1 % (ref 0.4–3.1)

## 2016-12-12 LAB — SEDIMENTATION RATE: Sed Rate: >140 mm/hr — ABNORMAL HIGH (ref 0–22)

## 2016-12-12 LAB — VITAMIN B12: Vitamin B-12: 5149 pg/mL — ABNORMAL HIGH (ref 180–914)

## 2016-12-12 LAB — FERRITIN: Ferritin: 941 ng/mL — ABNORMAL HIGH (ref 11–307)

## 2016-12-12 LAB — HIV ANTIBODY (ROUTINE TESTING W REFLEX): HIV Screen 4th Generation wRfx: NONREACTIVE

## 2016-12-12 LAB — FOLATE: FOLATE: 18.4 ng/mL (ref >5.9)

## 2016-12-12 MED ORDER — ENOXAPARIN SODIUM 40 MG/0.4ML ~~LOC~~ SOLN
40.0000 mg | SUBCUTANEOUS | Status: DC
  Administered 2016-12-12 – 2016-12-17 (×6): 40 mg via SUBCUTANEOUS
  Filled 2016-12-12 (×6): qty 0.4

## 2016-12-12 MED ORDER — INSULIN GLARGINE 100 UNIT/ML ~~LOC~~ SOLN
6.0000 [IU] | Freq: Every day | SUBCUTANEOUS | Status: DC
  Administered 2016-12-12 – 2016-12-14 (×3): 6 [IU] via SUBCUTANEOUS
  Filled 2016-12-12 (×3): qty 0.06

## 2016-12-12 MED ORDER — INSULIN ASPART 100 UNIT/ML ~~LOC~~ SOLN
0.0000 [IU] | Freq: Three times a day (TID) | SUBCUTANEOUS | Status: DC
  Administered 2016-12-12: 2 [IU] via SUBCUTANEOUS
  Administered 2016-12-12: 5 [IU] via SUBCUTANEOUS
  Administered 2016-12-13: 7 [IU] via SUBCUTANEOUS
  Administered 2016-12-13: 2 [IU] via SUBCUTANEOUS
  Administered 2016-12-13: 3 [IU] via SUBCUTANEOUS
  Administered 2016-12-14 (×2): 2 [IU] via SUBCUTANEOUS
  Administered 2016-12-14: 5 [IU] via SUBCUTANEOUS
  Administered 2016-12-15: 1 [IU] via SUBCUTANEOUS
  Administered 2016-12-15: 3 [IU] via SUBCUTANEOUS
  Administered 2016-12-15: 1 [IU] via SUBCUTANEOUS
  Administered 2016-12-16 – 2016-12-17 (×2): 2 [IU] via SUBCUTANEOUS

## 2016-12-12 MED ORDER — INSULIN ASPART 100 UNIT/ML ~~LOC~~ SOLN
0.0000 [IU] | Freq: Every day | SUBCUTANEOUS | Status: DC
  Administered 2016-12-13: 3 [IU] via SUBCUTANEOUS

## 2016-12-12 MED ORDER — POTASSIUM CHLORIDE CRYS ER 20 MEQ PO TBCR
40.0000 meq | EXTENDED_RELEASE_TABLET | Freq: Once | ORAL | Status: AC
  Administered 2016-12-12: 40 meq via ORAL
  Filled 2016-12-12: qty 2

## 2016-12-12 NOTE — Progress Notes (Signed)
PHARMACY - PHYSICIAN COMMUNICATION CRITICAL VALUE ALERT - BLOOD CULTURE IDENTIFICATION (BCID)  Results for orders placed or performed during the hospital encounter of 12/11/16  Blood Culture ID Panel (Reflexed) (Collected: 12/11/2016  2:18 PM)  Result Value Ref Range   Enterococcus species NOT DETECTED NOT DETECTED   Listeria monocytogenes NOT DETECTED NOT DETECTED   Staphylococcus species DETECTED (A) NOT DETECTED   Staphylococcus aureus NOT DETECTED NOT DETECTED   Methicillin resistance DETECTED (A) NOT DETECTED   Streptococcus species NOT DETECTED NOT DETECTED   Streptococcus agalactiae NOT DETECTED NOT DETECTED   Streptococcus pneumoniae NOT DETECTED NOT DETECTED   Streptococcus pyogenes NOT DETECTED NOT DETECTED   Acinetobacter baumannii NOT DETECTED NOT DETECTED   Enterobacteriaceae species NOT DETECTED NOT DETECTED   Enterobacter cloacae complex NOT DETECTED NOT DETECTED   Escherichia coli NOT DETECTED NOT DETECTED   Klebsiella oxytoca NOT DETECTED NOT DETECTED   Klebsiella pneumoniae NOT DETECTED NOT DETECTED   Proteus species NOT DETECTED NOT DETECTED   Serratia marcescens NOT DETECTED NOT DETECTED   Haemophilus influenzae NOT DETECTED NOT DETECTED   Neisseria meningitidis NOT DETECTED NOT DETECTED   Pseudomonas aeruginosa NOT DETECTED NOT DETECTED   Candida albicans NOT DETECTED NOT DETECTED   Candida glabrata NOT DETECTED NOT DETECTED   Candida krusei NOT DETECTED NOT DETECTED   Candida parapsilosis NOT DETECTED NOT DETECTED   Candida tropicalis NOT DETECTED NOT DETECTED    GPC in 1/2 blood cultures. BCID shows staph with methicillin resistance. Likely contaminant.   MD contacted: Dr. Lonny Prude  Changes to prescribed antibiotics required: No changes  Hildred Laser, Pharm D 12/12/2016 10:27 AM

## 2016-12-12 NOTE — Consult Note (Signed)
[facility]       Reason for Consult: osteomyelitis and gangrene    Referring Physician: Dr. Lonny Prude  Principal Problem:   Adjustment disorder with depressed mood Active Problems:   Gangrene ([place])   DKA (diabetic ketoacidoses) ([place])   Osteomyelitis ([facility])   High anion gap metabolic acidosis   . enoxaparin (LOVENOX) injection  40 mg Subcutaneous Q24H  . insulin aspart  0-5 Units Subcutaneous QHS  . insulin aspart  0-9 Units Subcutaneous TID WC  . insulin glargine  6 Units Subcutaneous Daily  . 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 Antibiotic therapy is not indicated for treatment, even with her calcaneal area, as long as she has gangrenous toes.  No role whatsoever in my opinion of outpatient IV antibiotics at this time.  If she does not agree to amputation, will need hospice +/- chronic suppressive oral antibiotic.   Dr. Baxter Flattery will follow up tomorrow  Antibiotics: Vancomycin, cefepime, metronidazole  HPI: Brenda Moore is a 61 y.o. female who does not get medical care who came in due to her right foot. It had been draining and some purulence and she had treated it with Epsom salt but continued to worsen.  It had been ongoing for months.  She does not report any associated fever, no chills, no n/v.  She has newly discovered diabetes.  She has not been on any medications.  She does not want surgery and tells me she would not want to live like that, without her toes or leg.  xray independently reviewed and the right great toe has a lot of destruction of the bone, swelling  Review of Systems:  Gastrointestinal: negative for diarrhea Integument/breast: negative for rash Behavioral/Psych: negative for sleep disturbance All other systems reviewed and are negative    History  reviewed. No pertinent past medical history.  Social History  Substance Use Topics  . Smoking status: Former Smoker    Packs/day: 0.75    Years: 40.00  . Smokeless tobacco: Never Used  . Alcohol use No    Family History  Problem Relation Age of Onset  . Lymphoma Mother   . Heart disease Mother   . Prostate cancer Father   . Skin cancer Father   . Stroke Father   . Seizures Father   . Epilepsy Brother   . Prostate cancer Brother     Allergies  Allergen Reactions  . Poison Oak Extract Rash    Physical Exam: Constitutional: in no apparent distress  Vitals:   12/12/16 0400 12/12/16 0803  BP:  123/70  Pulse: 73 75  Resp: 15 19  Temp:  98.2 F (36.8 C)   EYES: anicteric ENMT: no thrush Cardiovascular: Cor RRR Respiratory: CTA B; normal respiratory effort GI: Bowel sounds are normal, liver is not enlarged, spleen is not enlarged Musculoskeletal: no pedal edema noted Skin: negatives: no rash Hematologic: no cervical lad  Lab Results  Component Value Date   WBC 14.1 (H) 12/12/2016   HGB 8.2 (L) 12/12/2016   HCT 26.0 (L) 12/12/2016   MCV 81.3 12/12/2016   PLT 378 12/12/2016    Lab Results  Component Value Date   CREATININE 0.71 12/12/2016   BUN 11 12/12/2016   NA 131 (L) 12/12/2016   K 3.2 (L) 12/12/2016   CL 102 12/12/2016  CO2 21 (L) 12/12/2016    Lab Results  Component Value Date   ALT 39 12/11/2016   AST 29 12/11/2016   ALKPHOS 149 (H) 12/11/2016     Microbiology: Recent Results (from the past 240 hour(s))  Blood culture (routine x 2)     Status: None (Preliminary result)   Collection Time: 12/11/16  2:18 PM  Result Value Ref Range Status   Specimen Description BLOOD LEFT ANTECUBITAL  Final   Special Requests   Final    BOTTLES DRAWN AEROBIC AND ANAEROBIC Blood Culture adequate volume   Culture  Setup Time   Final    GRAM POSITIVE COCCI IN CLUSTERS IN BOTH AEROBIC AND ANAEROBIC BOTTLES CRITICAL RESULT CALLED TO, READ BACK BY AND VERIFIED  WITH: C BALL,PHARMD AT 1011 12/12/16 BY L BENFIELD    Culture GRAM POSITIVE COCCI  Final   Report Status PENDING  Incomplete  Blood Culture ID Panel (Reflexed)     Status: Abnormal   Collection Time: 12/11/16  2:18 PM  Result Value Ref Range Status   Enterococcus species NOT DETECTED NOT DETECTED Final   Listeria monocytogenes NOT DETECTED NOT DETECTED Final   Staphylococcus species DETECTED (A) NOT DETECTED Final    Comment: Methicillin (oxacillin) resistant coagulase negative staphylococcus. Possible blood culture contaminant (unless isolated from more than one blood culture draw or clinical case suggests pathogenicity). No antibiotic treatment is indicated for blood  culture contaminants. CRITICAL RESULT CALLED TO, READ BACK BY AND VERIFIED WITH: C BALL,PHARMD AT 1011 12/12/16 BY L BENFIELD    Staphylococcus aureus NOT DETECTED NOT DETECTED Final   Methicillin resistance DETECTED (A) NOT DETECTED Final    Comment: CRITICAL RESULT CALLED TO, READ BACK BY AND VERIFIED WITH: C BALL, PHARMD AT 1011 12/12/16 BY L BENFIELD    Streptococcus species NOT DETECTED NOT DETECTED Final   Streptococcus agalactiae NOT DETECTED NOT DETECTED Final   Streptococcus pneumoniae NOT DETECTED NOT DETECTED Final   Streptococcus pyogenes NOT DETECTED NOT DETECTED Final   Acinetobacter baumannii NOT DETECTED NOT DETECTED Final   Enterobacteriaceae species NOT DETECTED NOT DETECTED Final   Enterobacter cloacae complex NOT DETECTED NOT DETECTED Final   Escherichia coli NOT DETECTED NOT DETECTED Final   Klebsiella oxytoca NOT DETECTED NOT DETECTED Final   Klebsiella pneumoniae NOT DETECTED NOT DETECTED Final   Proteus species NOT DETECTED NOT DETECTED Final   Serratia marcescens NOT DETECTED NOT DETECTED Final   Haemophilus influenzae NOT DETECTED NOT DETECTED Final   Neisseria meningitidis NOT DETECTED NOT DETECTED Final   Pseudomonas aeruginosa NOT DETECTED NOT DETECTED Final   Candida albicans NOT DETECTED  NOT DETECTED Final   Candida glabrata NOT DETECTED NOT DETECTED Final   Candida krusei NOT DETECTED NOT DETECTED Final   Candida parapsilosis NOT DETECTED NOT DETECTED Final   Candida tropicalis NOT DETECTED NOT DETECTED Final  MRSA PCR Screening     Status: None   Collection Time: 12/11/16  9:16 PM  Result Value Ref Range Status   MRSA by PCR NEGATIVE NEGATIVE Final    Comment:        The GeneXpert MRSA Assay (FDA approved for NASAL specimens only), is one component of a comprehensive MRSA colonization surveillance program. It is not intended to diagnose MRSA infection nor to guide or monitor treatment for MRSA infections.     Scharlene Gloss, [facility] [place] [email or web address] [phone] pager  [phone] cell 12/12/2016, 2:24 PM

## 2016-12-12 NOTE — Consult Note (Addendum)
[facility] Consult   Reason for Consult:  Depressed mood Referring Physician:  Dr. Lonny Prude Patient Identification: Brenda Moore MRN:  [id number] Principal Diagnosis: Adjustment disorder with depressed mood Diagnosis:   Patient Active Problem List   Diagnosis Date Noted  . Adjustment disorder with depressed mood [F43.21] 12/12/2016  . Gangrene ([place]) [I96] 12/11/2016  . DKA (diabetic ketoacidoses) ([place]) [E13.10] 12/11/2016  . Osteomyelitis ([place]) [M86.9] 12/11/2016  . High anion gap metabolic acidosis [U44.0] 12/11/2016    Total Time spent with patient: 45 minutes  Subjective:   Brenda Moore is a 61 y.o. female patient admitted with bilateral foot infection.  HPI:  Patient who denies prior history of mental illness who was brought to the hospital for treatment of bilateral foot infection. Patient reports history of Diabetes and has been feeling fatigued for months. She neglected her personal hygiene and medical needs but demonstrate understanding of her action. Patient appears depressed, withdrawn but vehemently denies being depressed or suicidal. Patient denies prior history of Bipolar disorder, suicidal thoughts, depression, Anxiety or psychosis.  Past Psychiatric History: denies  Risk to Self: Is patient at risk for suicide?: No Risk to Others:   Prior Inpatient Therapy:   Prior Outpatient Therapy:    Past Medical History: History reviewed. No pertinent past medical history. History reviewed. No pertinent surgical history. Family History:  Family History  Problem Relation Age of Onset  . Lymphoma Mother   . Heart disease Mother   . Prostate cancer Father   . Skin cancer Father   . Stroke Father   . Seizures Father   . Epilepsy Brother   . Prostate cancer Brother    Family Psychiatric  History:  Social History:  History  Alcohol Use No     History  Drug Use No    Social History   Social History  . Marital status: Divorced    Spouse name: N/A  . Number  of children: N/A  . Years of education: N/A   Social History Main Topics  . Smoking status: Former Smoker    Packs/day: 0.75    Years: 40.00  . Smokeless tobacco: Never Used  . Alcohol use No  . Drug use: No  . Sexual activity: Not Asked   Other Topics Concern  . None   Social History Narrative  . None   Additional Social History:    Allergies:   Allergies  Allergen Reactions  . Poison Oak Extract Rash    Labs:  Results for orders placed or performed during the hospital encounter of 12/11/16 (from the past 48 hour(s))  Urinalysis, Routine w reflex microscopic     Status: Abnormal   Collection Time: 12/11/16  1:21 PM  Result Value Ref Range   Color, Urine YELLOW YELLOW   APPearance CLOUDY (A) CLEAR   Specific Gravity, Urine 1.011 1.005 - 1.030   pH 5.0 5.0 - 8.0   Glucose, UA NEGATIVE NEGATIVE mg/dL   Hgb urine dipstick LARGE (A) NEGATIVE   Bilirubin Urine NEGATIVE NEGATIVE   Ketones, ur NEGATIVE NEGATIVE mg/dL   Protein, ur 30 (A) NEGATIVE mg/dL   Nitrite NEGATIVE NEGATIVE   Leukocytes, UA MODERATE (A) NEGATIVE   RBC / HPF 6-30 0 - 5 RBC/hpf   WBC, UA 6-30 0 - 5 WBC/hpf   Bacteria, UA MANY (A) NONE SEEN   Squamous Epithelial / LPF 0-5 (A) NONE SEEN   Mucous PRESENT    Budding Yeast PRESENT   I-Stat CG4 Lactic Acid, ED  Status: None   Collection Time: 12/11/16  1:49 PM  Result Value Ref Range   Lactic Acid, Venous 1.50 0.5 - 1.9 mmol/L  Blood culture (routine x 2)     Status: None (Preliminary result)   Collection Time: 12/11/16  2:18 PM  Result Value Ref Range   Specimen Description BLOOD LEFT ANTECUBITAL    Special Requests      BOTTLES DRAWN AEROBIC AND ANAEROBIC Blood Culture adequate volume   Culture  Setup Time      GRAM POSITIVE COCCI IN CLUSTERS IN BOTH AEROBIC AND ANAEROBIC BOTTLES CRITICAL RESULT CALLED TO, READ BACK BY AND VERIFIED WITH: C BALL,PHARMD AT 1011 12/12/16 BY L BENFIELD    Culture GRAM POSITIVE COCCI    Report Status PENDING    Blood Culture ID Panel (Reflexed)     Status: Abnormal   Collection Time: 12/11/16  2:18 PM  Result Value Ref Range   Enterococcus species NOT DETECTED NOT DETECTED   Listeria monocytogenes NOT DETECTED NOT DETECTED   Staphylococcus species DETECTED (A) NOT DETECTED    Comment: Methicillin (oxacillin) resistant coagulase negative staphylococcus. Possible blood culture contaminant (unless isolated from more than one blood culture draw or clinical case suggests pathogenicity). No antibiotic treatment is indicated for blood  culture contaminants. CRITICAL RESULT CALLED TO, READ BACK BY AND VERIFIED WITH: C BALL,PHARMD AT 1011 12/12/16 BY L BENFIELD    Staphylococcus aureus NOT DETECTED NOT DETECTED   Methicillin resistance DETECTED (A) NOT DETECTED    Comment: CRITICAL RESULT CALLED TO, READ BACK BY AND VERIFIED WITH: C BALL, PHARMD AT 1011 12/12/16 BY L BENFIELD    Streptococcus species NOT DETECTED NOT DETECTED   Streptococcus agalactiae NOT DETECTED NOT DETECTED   Streptococcus pneumoniae NOT DETECTED NOT DETECTED   Streptococcus pyogenes NOT DETECTED NOT DETECTED   Acinetobacter baumannii NOT DETECTED NOT DETECTED   Enterobacteriaceae species NOT DETECTED NOT DETECTED   Enterobacter cloacae complex NOT DETECTED NOT DETECTED   Escherichia coli NOT DETECTED NOT DETECTED   Klebsiella oxytoca NOT DETECTED NOT DETECTED   Klebsiella pneumoniae NOT DETECTED NOT DETECTED   Proteus species NOT DETECTED NOT DETECTED   Serratia marcescens NOT DETECTED NOT DETECTED   Haemophilus influenzae NOT DETECTED NOT DETECTED   Neisseria meningitidis NOT DETECTED NOT DETECTED   Pseudomonas aeruginosa NOT DETECTED NOT DETECTED   Candida albicans NOT DETECTED NOT DETECTED   Candida glabrata NOT DETECTED NOT DETECTED   Candida krusei NOT DETECTED NOT DETECTED   Candida parapsilosis NOT DETECTED NOT DETECTED   Candida tropicalis NOT DETECTED NOT DETECTED  CBC with Differential     Status: Abnormal    Collection Time: 12/11/16  2:29 PM  Result Value Ref Range   WBC 15.7 (H) 4.0 - 10.5 K/uL   RBC 4.10 3.87 - 5.11 MIL/uL   Hemoglobin 11.0 (L) 12.0 - 15.0 g/dL   HCT 33.7 (L) 36.0 - 46.0 %   MCV 82.2 78.0 - 100.0 fL   MCH 26.8 26.0 - 34.0 pg   MCHC 32.6 30.0 - 36.0 g/dL   RDW 14.1 11.5 - 15.5 %   Platelets 406 (H) 150 - 400 K/uL   Neutrophils Relative % 85 %   Neutro Abs 13.2 (H) 1.7 - 7.7 K/uL   Lymphocytes Relative 11 %   Lymphs Abs 1.8 0.7 - 4.0 K/uL   Monocytes Relative 4 %   Monocytes Absolute 0.7 0.1 - 1.0 K/uL   Eosinophils Relative 0 %   Eosinophils Absolute 0.0 0.0 -  0.7 K/uL   Basophils Relative 0 %   Basophils Absolute 0.0 0.0 - 0.1 K/uL  Comprehensive metabolic panel     Status: Abnormal   Collection Time: 12/11/16  2:29 PM  Result Value Ref Range   Sodium 128 (L) 135 - 145 mmol/L   Potassium 4.0 3.5 - 5.1 mmol/L   Chloride 92 (L) 101 - 111 mmol/L   CO2 20 (L) 22 - 32 mmol/L   Glucose, Bld 305 (H) 65 - 99 mg/dL   BUN 18 6 - 20 mg/dL   Creatinine, Ser 1.07 (H) 0.44 - 1.00 mg/dL   Calcium 9.2 8.9 - 10.3 mg/dL   Total Protein 7.4 6.5 - 8.1 g/dL   Albumin 1.8 (L) 3.5 - 5.0 g/dL   AST 29 15 - 41 U/L   ALT 39 14 - 54 U/L   Alkaline Phosphatase 149 (H) 38 - 126 U/L   Total Bilirubin 0.9 0.3 - 1.2 mg/dL   GFR calc non Af Amer 55 (L) >60 mL/min   GFR calc Af Amer >60 >60 mL/min    Comment: (NOTE) The eGFR has been calculated using the CKD EPI equation. This calculation has not been validated in all clinical situations. eGFR's persistently <60 mL/min signify possible Chronic Kidney Disease.    Anion gap 16 (H) 5 - 15  CBG monitoring, ED     Status: Abnormal   Collection Time: 12/11/16  6:34 PM  Result Value Ref Range   Glucose-Capillary 226 (H) 65 - 99 mg/dL  CBG monitoring, ED     Status: Abnormal   Collection Time: 12/11/16  7:54 PM  Result Value Ref Range   Glucose-Capillary 285 (H) 65 - 99 mg/dL  MRSA PCR Screening     Status: None   Collection Time:  12/11/16  9:16 PM  Result Value Ref Range   MRSA by PCR NEGATIVE NEGATIVE    Comment:        The GeneXpert MRSA Assay (FDA approved for NASAL specimens only), is one component of a comprehensive MRSA colonization surveillance program. It is not intended to diagnose MRSA infection nor to guide or monitor treatment for MRSA infections.   Glucose, capillary     Status: Abnormal   Collection Time: 12/11/16  9:35 PM  Result Value Ref Range   Glucose-Capillary 218 (H) 65 - 99 mg/dL  Basic metabolic panel     Status: Abnormal   Collection Time: 12/11/16 10:35 PM  Result Value Ref Range   Sodium 130 (L) 135 - 145 mmol/L   Potassium 3.5 3.5 - 5.1 mmol/L   Chloride 100 (L) 101 - 111 mmol/L   CO2 19 (L) 22 - 32 mmol/L   Glucose, Bld 200 (H) 65 - 99 mg/dL   BUN 15 6 - 20 mg/dL   Creatinine, Ser 0.83 0.44 - 1.00 mg/dL   Calcium 8.3 (L) 8.9 - 10.3 mg/dL   GFR calc non Af Amer >60 >60 mL/min   GFR calc Af Amer >60 >60 mL/min    Comment: (NOTE) The eGFR has been calculated using the CKD EPI equation. This calculation has not been validated in all clinical situations. eGFR's persistently <60 mL/min signify possible Chronic Kidney Disease.    Anion gap 11 5 - 15  Basic metabolic panel     Status: Abnormal   Collection Time: 12/11/16 10:35 PM  Result Value Ref Range   Sodium 131 (L) 135 - 145 mmol/L   Potassium 3.5 3.5 - 5.1 mmol/L   Chloride   100 (L) 101 - 111 mmol/L   CO2 21 (L) 22 - 32 mmol/L   Glucose, Bld 202 (H) 65 - 99 mg/dL   BUN 15 6 - 20 mg/dL   Creatinine, Ser 0.83 0.44 - 1.00 mg/dL   Calcium 8.5 (L) 8.9 - 10.3 mg/dL   GFR calc non Af Amer >60 >60 mL/min   GFR calc Af Amer >60 >60 mL/min    Comment: (NOTE) The eGFR has been calculated using the CKD EPI equation. This calculation has not been validated in all clinical situations. eGFR's persistently <60 mL/min signify possible Chronic Kidney Disease.    Anion gap 10 5 - 15  Sedimentation rate     Status: Abnormal    Collection Time: 12/11/16 10:35 PM  Result Value Ref Range   Sed Rate >140 (H) 0 - 22 mm/hr  C-reactive protein     Status: Abnormal   Collection Time: 12/11/16 10:35 PM  Result Value Ref Range   CRP 29.7 (H) <1.0 mg/dL  Prealbumin     Status: Abnormal   Collection Time: 12/11/16 10:35 PM  Result Value Ref Range   Prealbumin <5 (L) 18 - 38 mg/dL  Beta-hydroxybutyric acid     Status: Abnormal   Collection Time: 12/11/16 10:35 PM  Result Value Ref Range   Beta-Hydroxybutyric Acid 0.97 (H) 0.05 - 0.27 mmol/L  Glucose, capillary     Status: Abnormal   Collection Time: 12/11/16 10:35 PM  Result Value Ref Range   Glucose-Capillary 210 (H) 65 - 99 mg/dL  Glucose, capillary     Status: Abnormal   Collection Time: 12/11/16 11:34 PM  Result Value Ref Range   Glucose-Capillary 185 (H) 65 - 99 mg/dL  Glucose, capillary     Status: Abnormal   Collection Time: 12/12/16 12:42 AM  Result Value Ref Range   Glucose-Capillary 136 (H) 65 - 99 mg/dL  Glucose, capillary     Status: Abnormal   Collection Time: 12/12/16  1:44 AM  Result Value Ref Range   Glucose-Capillary 110 (H) 65 - 99 mg/dL  Glucose, capillary     Status: Abnormal   Collection Time: 12/12/16  2:54 AM  Result Value Ref Range   Glucose-Capillary 133 (H) 65 - 99 mg/dL  Glucose, capillary     Status: Abnormal   Collection Time: 12/12/16  3:56 AM  Result Value Ref Range   Glucose-Capillary 138 (H) 65 - 99 mg/dL  Glucose, capillary     Status: Abnormal   Collection Time: 12/12/16  5:01 AM  Result Value Ref Range   Glucose-Capillary 150 (H) 65 - 99 mg/dL  Basic metabolic panel     Status: Abnormal   Collection Time: 12/12/16  5:50 AM  Result Value Ref Range   Sodium 132 (L) 135 - 145 mmol/L   Potassium 3.4 (L) 3.5 - 5.1 mmol/L   Chloride 100 (L) 101 - 111 mmol/L   CO2 22 22 - 32 mmol/L   Glucose, Bld 163 (H) 65 - 99 mg/dL   BUN 12 6 - 20 mg/dL   Creatinine, Ser 0.76 0.44 - 1.00 mg/dL   Calcium 8.3 (L) 8.9 - 10.3 mg/dL   GFR  calc non Af Amer >60 >60 mL/min   GFR calc Af Amer >60 >60 mL/min    Comment: (NOTE) The eGFR has been calculated using the CKD EPI equation. This calculation has not been validated in all clinical situations. eGFR's persistently <60 mL/min signify possible Chronic Kidney Disease.    Anion gap   10 5 - 15  CBC     Status: Abnormal   Collection Time: 12/12/16  5:50 AM  Result Value Ref Range   WBC 14.1 (H) 4.0 - 10.5 K/uL   RBC 3.20 (L) 3.87 - 5.11 MIL/uL   Hemoglobin 8.2 (L) 12.0 - 15.0 g/dL    Comment: REPEATED TO VERIFY DELTA CHECK NOTED    HCT 26.0 (L) 36.0 - 46.0 %   MCV 81.3 78.0 - 100.0 fL   MCH 25.6 (L) 26.0 - 34.0 pg   MCHC 31.5 30.0 - 36.0 g/dL   RDW 14.0 11.5 - 15.5 %   Platelets 378 150 - 400 K/uL  Glucose, capillary     Status: Abnormal   Collection Time: 12/12/16  5:59 AM  Result Value Ref Range   Glucose-Capillary 162 (H) 65 - 99 mg/dL  Glucose, capillary     Status: Abnormal   Collection Time: 12/12/16  6:55 AM  Result Value Ref Range   Glucose-Capillary 192 (H) 65 - 99 mg/dL  Glucose, capillary     Status: Abnormal   Collection Time: 12/12/16  7:59 AM  Result Value Ref Range   Glucose-Capillary 176 (H) 65 - 99 mg/dL  Glucose, capillary     Status: Abnormal   Collection Time: 12/12/16  8:59 AM  Result Value Ref Range   Glucose-Capillary 168 (H) 65 - 99 mg/dL  Basic metabolic panel     Status: Abnormal   Collection Time: 12/12/16  9:32 AM  Result Value Ref Range   Sodium 131 (L) 135 - 145 mmol/L   Potassium 3.2 (L) 3.5 - 5.1 mmol/L   Chloride 102 101 - 111 mmol/L   CO2 21 (L) 22 - 32 mmol/L   Glucose, Bld 187 (H) 65 - 99 mg/dL   BUN 11 6 - 20 mg/dL   Creatinine, Ser 0.71 0.44 - 1.00 mg/dL   Calcium 8.1 (L) 8.9 - 10.3 mg/dL   GFR calc non Af Amer >60 >60 mL/min   GFR calc Af Amer >60 >60 mL/min    Comment: (NOTE) The eGFR has been calculated using the CKD EPI equation. This calculation has not been validated in all clinical situations. eGFR's  persistently <60 mL/min signify possible Chronic Kidney Disease.    Anion gap 8 5 - 15  Glucose, capillary     Status: Abnormal   Collection Time: 12/12/16 10:04 AM  Result Value Ref Range   Glucose-Capillary 173 (H) 65 - 99 mg/dL  Glucose, capillary     Status: Abnormal   Collection Time: 12/12/16 11:01 AM  Result Value Ref Range   Glucose-Capillary 190 (H) 65 - 99 mg/dL  Glucose, capillary     Status: Abnormal   Collection Time: 12/12/16 12:11 PM  Result Value Ref Range   Glucose-Capillary 174 (H) 65 - 99 mg/dL  Glucose, capillary     Status: Abnormal   Collection Time: 12/12/16  1:02 PM  Result Value Ref Range   Glucose-Capillary 166 (H) 65 - 99 mg/dL    Current Facility-Administered Medications  Medication Dose Route Frequency Provider Last Rate Last Dose  . acetaminophen (TYLENOL) tablet 650 mg  650 mg Oral Q6H PRN Mariel Aloe, MD       Or  . acetaminophen (TYLENOL) suppository 650 mg  650 mg Rectal Q6H PRN Mariel Aloe, MD      . ceFEPIme (MAXIPIME) 1 g in dextrose 5 % 50 mL IVPB  1 g Intravenous Q8H Franky Macho, RPH 100 mL/hr  at 12/12/16 1411 1 g at 12/12/16 1411  . enoxaparin (LOVENOX) injection 40 mg  40 mg Subcutaneous Q24H Nettey, Ralph A, MD   40 mg at 12/12/16 0900  . insulin aspart (novoLOG) injection 0-5 Units  0-5 Units Subcutaneous QHS Nettey, Ralph A, MD      . insulin aspart (novoLOG) injection 0-9 Units  0-9 Units Subcutaneous TID WC Nettey, Ralph A, MD   2 Units at 12/12/16 1309  . insulin glargine (LANTUS) injection 6 Units  6 Units Subcutaneous Daily Nettey, Ralph A, MD   6 Units at 12/12/16 1058  . metroNIDAZOLE (FLAGYL) IVPB 500 mg  500 mg Intravenous Q8H Nettey, Ralph A, MD   Stopped at 12/12/16 0817  . potassium chloride SA (K-DUR,KLOR-CON) CR tablet 40 mEq  40 mEq Oral Once Nettey, Ralph A, MD      . vancomycin (VANCOCIN) IVPB 1000 mg/200 mL premix  1,000 mg Intravenous Q12H Amend, Caron G, RPH   Stopped at 12/12/16 0651     Musculoskeletal: Strength & Muscle Tone: not tested Gait & Station: unable to stand Patient leans: N/A  Psychiatric Specialty Exam: Physical Exam  Psychiatric: Her speech is normal. Judgment and thought content normal. Her affect is blunt. She is slowed. Cognition and memory are normal.    Review of Systems  Constitutional: Positive for malaise/fatigue.  HENT: Negative.   Eyes: Negative.   Respiratory: Negative.   Cardiovascular: Negative.   Gastrointestinal: Negative.   Genitourinary: Negative.   Musculoskeletal: Negative.   Skin: Negative.   Neurological: Negative.   Endo/Heme/Allergies: Negative.   Psychiatric/Behavioral: Positive for depression.    Blood pressure 123/70, pulse 75, temperature 98.2 F (36.8 C), temperature source Oral, resp. rate 19, height 5' 10" (1.778 m), weight 67.5 kg (148 lb 13 oz), SpO2 98 %.Body mass index is 21.35 kg/m.  General Appearance: Disheveled  Eye Contact:  Minimal  Speech:  Clear and Coherent  Volume:  Decreased  Mood:  Depressed and Dysphoric  Affect:  Constricted  Thought Process:  Coherent and Descriptions of Associations: Intact  Orientation:  Full (Time, Place, and Person)  Thought Content:  Logical  Suicidal Thoughts:  No  Homicidal Thoughts:  No  Memory:  Immediate;   Good Recent;   Good Remote;   Good  Judgement:  Intact  Insight:  Fair  Psychomotor Activity:  Decreased and Psychomotor Retardation  Concentration:  Concentration: Fair and Attention Span: Fair  Recall:  Good  Fund of Knowledge:  Good  Language:  Good  Akathisia:  No  Handed:  Right  AIMS (if indicated):     Assets:  Communication Skills Desire for Improvement  ADL's:  Impaired  Cognition:  WNL  Sleep:   fair     Treatment Plan Summary:  Patient who denies history of mental illness but has been isolating herself and withdrawn from her family for the past 3-4 months. Patient denies depression but appears dysphoric and unmotivated.    Plan/Recommendation: Patient will benefit from antidepressant to address her symptoms, currently she is not willing to take medications.  Disposition: No evidence of imminent risk to self or others at present.   Patient does not meet criteria for psychiatric inpatient admission. Supportive therapy provided about ongoing stressors. Re-consult psychiatric service if necessary in future  Akintayo, Mojeed, MD 12/12/2016 2:20 PM 

## 2016-12-12 NOTE — Progress Notes (Signed)
PROGRESS NOTE    Brenda Moore  TKW:[id number] DOB: [birth year] DOA: 12/11/2016 PCP: Patient, No Pcp Per   Brief Narrative: Brenda Moore is a 61 y.o. female with no past medical history presented with dry gangrene and osteomyelitis of her right foot. Patient is obviously very depressed but denies being depressed. No sepsis at this time. Started on empiric antibiotic therapy. Orthopedic surgery consulted and suggesting right limb amputation, however, patient is refusing. Psych consulted for evaluation. Infectious disease consulted since patient is refusing amputation.   Assessment & Plan:   Principal Problem:   Gangrene ([place]) Active Problems:   DKA (diabetic ketoacidoses) ([place])   Osteomyelitis ([facility])   High anion gap metabolic acidosis   Gangrene Diabetic foot ulcer Osteomyelitis Patient refusing amputation at this time. -Continue empiric vancomycin, cefepime, metronidazole -nutrition consult -ABIs -psych consult for capacity/depression -infectious disease consult  Diabetic ketoacidosis Diabetes mellitus High anion gap metabolic acidosis No diagnosis of diabetes prior to admission. Patient meets criteria for diabetes with random glucose of 305 and low CO2 suggesting acidosis. Anion gap is elevated at 16. beta-hydroxybutyric acid elevated. Gap has closed and CO2 improved. -transition to Lantus 6 units daily -discontinue insulin drip 2 hours after Lantus has been given -carb modified diet -Hemoglobin A1c -urinalysis pending -diabetic coordinator  Depressed mood Patient denies symptoms of depressed mood or history of depression. On evaluation, patient appears to be having depressed mood. -Psychiatry consulted on 5/20  Anemia Worsened secondary to dilutional effect most likely. No evidence of bleeding. Normocytic -anemia panel.  Hyponatremia Likely secondary to volume depletion. Improved. -IVF -oral hydration   DVT prophylaxis: SCDs, Lovenox Code Status: DNR/DNI Family  Communication: None at bedside Disposition Plan: Discharge pending continued management of DKA and foot gangrene/osteo   Consultants:   Orthopedic surgery, Dr. Marlou Sa  Psychiatry  Infectious disease  Procedures:   Insulin drip (5/19>>  Antimicrobials:  Zosyn (5/19)  Vancomycin (5/19>>  Cefepime (5/19>>  Metronidazole (5/19>>    Subjective: Patient reports no issues overnight other than multiple blood draws. Pain is intermittent but manageable.   Objective: Vitals:   12/11/16 2030 12/11/16 2328 12/12/16 0346 12/12/16 0400  BP: 129/67 (!) 108/53 (!) 104/45   Pulse: 77 80 73 73  Resp: 17 14 16 15   Temp: 98.3 F (36.8 C) 98.3 F (36.8 C) 98.5 F (36.9 C)   TempSrc: Oral Oral Oral   SpO2: 94% 97% 97% 97%  Weight: 67.5 kg (148 lb 13 oz)     Height: 5\' 10"  (1.778 m)       Intake/Output Summary (Last 24 hours) at 12/12/16 0744 Last data filed at 12/12/16 0654  Gross per 24 hour  Intake            618.5 ml  Output              600 ml  Net             18.5 ml   Filed Weights   12/11/16 1440 12/11/16 2030  Weight: 72.6 kg (160 lb) 67.5 kg (148 lb 13 oz)    Examination:  General exam: Appears calm and comfortable Respiratory system: Clear to auscultation. Respiratory effort normal. Cardiovascular system: S1 & S2 heard, RRR. No murmurs. Gastrointestinal system: Abdomen is nondistended, soft and nontender. No organomegaly or masses felt. Normal bowel sounds heard. Central nervous system: Alert and oriented. No focal neurological deficits. Extremities: No edema. No calf tenderness. Right foot is wrapped in gauze that is clean and intact. Left foot with  necrotic appearing tissue over MTP joint Skin: No cyanosis. No rashes Psychiatry: Judgement and insight appear impaired. Depressed and flat affect. No suicidal ideation.    Data Reviewed: I have personally reviewed following labs and imaging studies  CBC:  Recent Labs Lab 12/11/16 1429 12/12/16 0550  WBC  15.7* 14.1*  NEUTROABS 13.2*  --   HGB 11.0* 8.2*  HCT 33.7* 26.0*  MCV 82.2 81.3  PLT 406* 229   Basic Metabolic Panel:  Recent Labs Lab 12/11/16 1429 12/11/16 2235  NA 128* 131*  130*  K 4.0 3.5  3.5  CL 92* 100*  100*  CO2 20* 21*  19*  GLUCOSE 305* 202*  200*  BUN 18 15  15   CREATININE 1.07* 0.83  0.83  CALCIUM 9.2 8.5*  8.3*   GFR: Estimated Creatinine Clearance: 76.8 mL/min (by C-G formula based on SCr of 0.83 mg/dL). Liver Function Tests:  Recent Labs Lab 12/11/16 1429  AST 29  ALT 39  ALKPHOS 149*  BILITOT 0.9  PROT 7.4  ALBUMIN 1.8*   No results for input(s): LIPASE, AMYLASE in the last 168 hours. No results for input(s): AMMONIA in the last 168 hours. Coagulation Profile: No results for input(s): INR, PROTIME in the last 168 hours. Cardiac Enzymes: No results for input(s): CKTOTAL, CKMB, CKMBINDEX, TROPONINI in the last 168 hours. BNP (last 3 results) No results for input(s): PROBNP in the last 8760 hours. HbA1C: No results for input(s): HGBA1C in the last 72 hours. CBG:  Recent Labs Lab 12/12/16 0254 12/12/16 0356 12/12/16 0501 12/12/16 0559 12/12/16 0655  GLUCAP 133* 138* 150* 162* 192*   Lipid Profile: No results for input(s): CHOL, HDL, LDLCALC, TRIG, CHOLHDL, LDLDIRECT in the last 72 hours. Thyroid Function Tests: No results for input(s): TSH, T4TOTAL, FREET4, T3FREE, THYROIDAB in the last 72 hours. Anemia Panel: No results for input(s): VITAMINB12, FOLATE, FERRITIN, TIBC, IRON, RETICCTPCT in the last 72 hours. Sepsis Labs:  Recent Labs Lab 12/11/16 1349  LATICACIDVEN 1.50    Recent Results (from the past 240 hour(s))  MRSA PCR Screening     Status: None   Collection Time: 12/11/16  9:16 PM  Result Value Ref Range Status   MRSA by PCR NEGATIVE NEGATIVE Final    Comment:        The GeneXpert MRSA Assay (FDA approved for NASAL specimens only), is one component of a comprehensive MRSA colonization surveillance  program. It is not intended to diagnose MRSA infection nor to guide or monitor treatment for MRSA infections.          Radiology Studies: Dg Foot Complete Right  Result Date: 12/11/2016 CLINICAL DATA:  Necrosis of bilateral feet. EXAM: RIGHT FOOT COMPLETE - 3+ VIEW COMPARISON:  None. FINDINGS: Significant soft tissue gas identified overlying the calcaneus, the lateral midfoot, and numerous toes. There is significant bony destruction associated with the first proximal and distal phalanges. Involvement of the adjacent first metatarsal is also identified. The second through fourth toes and second through fourth metatarsals demonstrate no fracture or bony erosion. The tarsal bones are normal. Lucency along the posterosuperior calcaneus is suspicious for osteomyelitis based on the lateral view. An MRI could better assess the extent of osteomyelitis. IMPRESSION: 1. Diffuse soft tissue gas consistent with a gas-forming organism. 2. Significant destruction associated with the great toe and adjacent first metatarsal consistent with osteomyelitis. 3. Suspected osteomyelitis along the posterosuperior aspect of the calcaneus. An MRI could better assess the extent of osteomyelitis. Electronically Signed   By:  Dorise Bullion III M.D   On: 12/11/2016 14:29        Scheduled Meds: Continuous Infusions: . sodium chloride    . ceFEPime (MAXIPIME) IV Stopped (12/12/16 1735)  . dextrose 5 % and 0.45% NaCl 100 mL/hr at 12/12/16 0651  . insulin (NOVOLIN-R) infusion 1.3 mL/hr at 12/12/16 0654  . metronidazole 500 mg (12/12/16 0701)  . vancomycin Stopped (12/12/16 6701)     LOS: 1 day     Cordelia Poche, MD [facility] 12/12/2016, 7:44 AM Pager: [phone]  If 7PM-7AM, please contact night-coverage [email or web address] Password [facility] 12/12/2016, 7:44 AM

## 2016-12-13 ENCOUNTER — Encounter (HOSPITAL_COMMUNITY): Payer: Self-pay

## 2016-12-13 DIAGNOSIS — F4321 Adjustment disorder with depressed mood: Secondary | ICD-10-CM

## 2016-12-13 DIAGNOSIS — Z515 Encounter for palliative care: Secondary | ICD-10-CM

## 2016-12-13 DIAGNOSIS — R7881 Bacteremia: Secondary | ICD-10-CM

## 2016-12-13 DIAGNOSIS — I96 Gangrene, not elsewhere classified: Secondary | ICD-10-CM

## 2016-12-13 DIAGNOSIS — Z7189 Other specified counseling: Secondary | ICD-10-CM

## 2016-12-13 LAB — BASIC METABOLIC PANEL
ANION GAP: 8 (ref 5–15)
BUN: 12 mg/dL (ref 6–20)
CHLORIDE: 101 mmol/L (ref 101–111)
CO2: 22 mmol/L (ref 22–32)
Calcium: 8.1 mg/dL — ABNORMAL LOW (ref 8.9–10.3)
Creatinine, Ser: 0.89 mg/dL (ref 0.44–1.00)
GFR calc non Af Amer: >60 mL/min (ref >60)
Glucose, Bld: 176 mg/dL — ABNORMAL HIGH (ref 65–99)
POTASSIUM: 4 mmol/L (ref 3.5–5.1)
SODIUM: 131 mmol/L — AB (ref 135–145)

## 2016-12-13 LAB — CBC
HEMATOCRIT: 26.1 % — AB (ref 36.0–46.0)
Hemoglobin: 8.3 g/dL — ABNORMAL LOW (ref 12.0–15.0)
MCH: 25.9 pg — ABNORMAL LOW (ref 26.0–34.0)
MCHC: 31.8 g/dL (ref 30.0–36.0)
MCV: 81.3 fL (ref 78.0–100.0)
Platelets: 383 10*3/uL (ref 150–400)
RBC: 3.21 MIL/uL — AB (ref 3.87–5.11)
RDW: 14.1 % (ref 11.5–15.5)
WBC: 12.7 10*3/uL — ABNORMAL HIGH (ref 4.0–10.5)

## 2016-12-13 LAB — GLUCOSE, CAPILLARY
GLUCOSE-CAPILLARY: 321 mg/dL — AB (ref 65–99)
GLUCOSE-CAPILLARY: 176 mg/dL — AB (ref 65–99)
GLUCOSE-CAPILLARY: 282 mg/dL — AB (ref 65–99)
Glucose-Capillary: 218 mg/dL — ABNORMAL HIGH (ref 65–99)

## 2016-12-13 LAB — HEMOGLOBIN A1C
Hgb A1c MFr Bld: 14.2 % — ABNORMAL HIGH (ref 4.8–5.6)
Mean Plasma Glucose: 361 mg/dL

## 2016-12-13 MED ORDER — LIVING WELL WITH DIABETES BOOK
Freq: Once | Status: AC
  Administered 2016-12-13: 14:00:00
  Filled 2016-12-13: qty 1

## 2016-12-13 NOTE — Progress Notes (Signed)
Attempted to call report to unit [place]. Nurse and charge nurse unavailable (unit emergency in progress at this time), unit NS will have nurse return call for report.

## 2016-12-13 NOTE — Progress Notes (Signed)
Pt transferred to 6N20 after palliative NP spent time with pt and family. Pt also had to use the bedpan prior to being transferred. Pt transferred with her belongings--purse with wallet (she made sure she had it before leaving room), robe and t-shirt, as well as hospital belongings used for care.  Transferred via bed.

## 2016-12-13 NOTE — Progress Notes (Signed)
Events noted Amputation recommended right lower extremity Pt not agreeable at this time

## 2016-12-13 NOTE — Progress Notes (Signed)
Initial Nutrition Assessment  DOCUMENTATION CODES:   Not applicable  INTERVENTION:   -Continue with carb modified diet -RD make further recommendations based upon GOC decisions  NUTRITION DIAGNOSIS:   Increased nutrient needs related to wound healing as evidenced by estimated needs.  GOAL:   Patient will meet greater than or equal to 90% of their needs  MONITOR:   PO intake, Supplement acceptance, Labs, Weight trends, Skin, I & O's  REASON FOR ASSESSMENT:   Consult Wound healing (Diet education)  ASSESSMENT:   Brenda Moore is a 61 y.o. female with no significant medical history. About 2 months ago, patient noticed that she was weeping blood from her right foot. She was concerned that there is possibly some kind of "impurity" and decided to try Epsom salt to "draw out the impurities." Over time, she started noticing some purulent drainage emanating from her wound. She reports associated pain and was using Tylenol to help. She did not seek medical attention because she thought it would go away on its own.   Pt admitted with rt DM foot infection with gangrene and osteomyelitis.   RD received consult for wound healing and education on new onset DM. Per DM coordinator note, Hgb A1c 14.2 and pt had previously been diagnosed with pre-diabetes.   Case discussed extensively with RN, who reports pt with poor appetite (consumed 50% of pancakes and sausage patty this morning). She reports that pt is extremely flat and has not been talking to staff members. Additionally, she has been refusing care, including recommendation for rt BKA. Per psychiatry, pt with capacity for decision making. ID is recommending hospice if pt unagreeable to amputation. RN reports she is planning on holding off on bedside DM education (Living Well with Diabetes Booklet and educational videos) until goals of care are addressed. RN confirmed plan for palliative care to evaluate pt today.   RN agreeable to plan to  address further acute nutrition and education needs based upon goals of care discussions.   Labs reviewed: Na: 131, CBGS: 138-321.   Diet Order:  Diet Carb Modified Fluid consistency: Thin; Room service appropriate? Yes  Skin:  Wound (see comment) (rt DM foot ulcer with dry gangrene)  Last BM:  12/13/16  Height:   Ht Readings from Last 1 Encounters:  12/11/16 5\' 10"  (1.778 m)    Weight:   Wt Readings from Last 1 Encounters:  12/11/16 148 lb 13 oz (67.5 kg)    Ideal Body Weight:  72.7 kg  BMI:  Body mass index is 21.35 kg/m.  Estimated Nutritional Needs:   Kcal:  1700-1900  Protein:  90-105 grams  Fluid:  >1.7 L  EDUCATION NEEDS:   Education needs no appropriate at this time  Brenda Moore A. Brenda Moore, RD, LDN, CDE Pager: [phone] After hours Pager: [phone]

## 2016-12-13 NOTE — Progress Notes (Signed)
Report called to Morey Hummingbird, RN on unit 6N. Pt will transfer to med-surg bed room 6N20.  Currently palliative care NP in with pt. Will transfer pt as soon as palliative done with consult at bedside. (pt's daughter and pt's brother here and made aware of new room number).

## 2016-12-13 NOTE — Progress Notes (Addendum)
Inpatient Diabetes Program Recommendations  AACE/ADA: New Consensus Statement on Inpatient Glycemic Control (2015)  Target Ranges:  Prepandial:   less than 140 mg/dL      Peak postprandial:   less than 180 mg/dL (1-2 hours)      Critically ill patients:  140 - 180 mg/dL   Lab Results  Component Value Date   GLUCAP 218 (H) 12/13/2016   HGBA1C 14.2 (H) 12/12/2016    Review of Glycemic Control Results for Brenda Moore, Brenda Moore (MRN [id number]) as of 12/13/2016 09:20  Ref. Range 12/12/2016 12:11 12/12/2016 13:02 12/12/2016 17:42 12/12/2016 23:02 12/13/2016 08:27  Glucose-Capillary Latest Ref Range: 65 - 99 mg/dL 174 (H) 166 (H) 282 (H) 138 (H) 218 (H)   Diabetes history: DM2 Outpatient Diabetes medications: No meds listed Current orders for Inpatient glycemic control: Lantus 6 units daily + Novolog correction 0-9 units tid + 0-5 units hs  Inpatient Diabetes Program Recommendations:    Please consider increase in Lantus to 12 units daily (0.2 units/kg x 67.5 kg). -Add meal coverage Novolog 4 units tid if eats 50% Patient does not currently have insurance listed so Novolin insulin 70/30 from [facility] will be cheaper for her to afford. Will speak to pt. Regarding A1c 14.2. Note patient shared with psychiatrist she has hx of DM in the past.  Will follow. 1:45 pm Patient states she has had prediabetes in the past but not DM until this admission. Spoke with pt, brother and daughter about new diagnosis. Discussed A1C results 14.2 and explained what an A1C is, basic pathophysiology of DM Type 2, basic home care, basic diabetes diet nutrition principles, importance of checking CBGs and maintaining good CBG control to prevent long-term and short-term complications. Reviewed signs and symptoms of hyperglycemia and hypoglycemia and how to treat hypoglycemia at home. Also reviewed blood sugar goals at home.  RNs to provide ongoing basic DM education at bedside with this patient. Have ordered educational booklet, insulin  starter kit, and DM videos. Have also placed RD consult for DM diet education for this patient.  Text page sent to Dr. Lonny Prude.   Thank you, [name]. [name], RN, MSN, CDE  Diabetes Coordinator Inpatient Glycemic Control Team Team Pager [phone] (8am-5pm) 12/13/2016 9:23 AM

## 2016-12-13 NOTE — Progress Notes (Signed)
[facility] for Infectious Disease    Date of Admission:  12/11/2016   Total days of antibiotics 3        Day 3 =, cefepime, metro,            ID: Brenda Moore is a 61 y.o. female with   Principal Problem:   Gangrene ([place]) Active Problems:   DKA (diabetic ketoacidoses) ([place])   Osteomyelitis ([place])   High anion gap metabolic acidosis   Adjustment disorder with depressed mood    Subjective: Afebrile, still does not want amputation.   Ros: no fever, chills, nightsweats Medications:  . enoxaparin (LOVENOX) injection  40 mg Subcutaneous Q24H  . insulin aspart  0-5 Units Subcutaneous QHS  . insulin aspart  0-9 Units Subcutaneous TID WC  . insulin glargine  6 Units Subcutaneous Daily    Objective: Vital signs in last 24 hours: Temp:  [98.5 F (36.9 C)-99.4 F (37.4 C)] 98.9 F (37.2 C) (05/21 1100) Pulse Rate:  [73-81] 78 (05/21 1100) Resp:  [14-17] 14 (05/21 1100) BP: (114-121)/(60-64) 120/60 (05/21 1100) SpO2:  [96 %-97 %] 96 % (05/21 1100) Physical Exam  Constitutional:  oriented to person, place, and time. appears well-developed and well-nourished. No distress.  HENT: [place]/AT, PERRLA, no scleral icterus Mouth/Throat: Oropharynx is clear and moist. No oropharyngeal exudate. rmal.  exhibits no distension. There is no tenderness.  Neurological: alert and oriented to person, place, and time.  Skin: right foot with necrotic changes, 3rd and 4th digits. Also over MTP, foul decaying odor Psychiatric: flat affect.  Poor insight  Lab Results  Recent Labs  12/12/16 0550  12/12/16 1451 12/13/16 0318  WBC 14.1*  --   --  12.7*  HGB 8.2*  --   --  8.3*  HCT 26.0*  --   --  26.1*  NA 132*  < > 131* 131*  K 3.4*  < > 3.4* 4.0  CL 100*  < > 103 101  CO2 22  < > 20* 22  BUN 12  < > 10 12  CREATININE 0.76  < > 0.88 0.89  < > = values in this interval not displayed. Liver Panel  Recent Labs  12/11/16 1429  PROT 7.4  ALBUMIN 1.8*  AST 29  ALT 39  ALKPHOS 149*    BILITOT 0.9   Sedimentation Rate  Recent Labs  12/11/16 2235  ESRSEDRATE >140*   C-Reactive Protein  Recent Labs  12/11/16 2235  CRP 29.7*    Microbiology: Blood cx 1 of 2 sets CONS MRSA screen is negative Studies/Results: No results found. Plain xray of right foot shows signs of gas in soft tissue lucency to calcaneous and toes concern for osteo to   Assessment/Plan: Necrotic gangrenous foot = best course of treatment would be amputation and patient does not want to have the surgery, even though I have mentioned she is at risk for overwhelming sepsis if infection worsens.i have mentioned that her toes will not improve and likely further progress the necrotic process. Patient would like to try oral abtx.  Though this is temporizing measure - you could discharge her on doxycyline 100mg  bid, cipro 500mg , plus flagyl 500mg  TID x 2-4 wk.  Agree with hospice care if she is not interested in pursuing treatment recommendations  Underlying poor insight/hx of mental illness = seen by psychiatry who recommended antidepressants. Could consider having the family make the patient incompetent to making decisions? Unclear how long she has been in this state  of mind.   Staph species bacteremia = likely contaminant. No need to treat.  Will sign off.  Baxter Flattery [facility] Infectious Diseases Cell: [phone] Pager: [phone]  12/13/2016, 6:07 PM

## 2016-12-13 NOTE — Progress Notes (Signed)
Patient arrived to 6n20, alert and oriented, IV fluids and antibiotics running, patient denies pain, states she is comfortable. Patient oriented to room and staff, will continue to monitor.

## 2016-12-13 NOTE — Consult Note (Signed)
Consultation Note Date: 12/13/2016   Patient Name: Brenda Moore  DOB: 1956-06-26  MRN: [id number]  Age / Sex: 61 y.o., female  PCP: Patient, No Pcp Per Referring Physician: Mariel Aloe, MD  Reason for Consultation: Establishing goals of care and Hospice Evaluation  HPI/Patient Profile: 61 y.o. female  with no past medical history admitted on 12/11/2016 with   Bilateral wounds to feet. 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 She notes ongoing fatigue and feeling "not normal" for a long time. Surgery has recommended amputation of R foot, however, patient has very clearly declined surgery. Palliative medicine consulted for [place].   Clinical Assessment and Goals of Care: Met with patient. Prior to admission she was living at home alone. She is tired of talking to people about her situation and is not interactive. She clearly states she does not want surgery. She has never gone to doctors and has always declined medical interventions. When asked her thought process regarding declining the surgery she states she does not want to live her life with amputated feet. We talked about the fact that she would die without the surgery and she is aware of that. Discussed option of stopping current antibiotic therapy and transitioning to comfort measures only which would lead to her death within a few weeks and she said she is not ready for that. She wishes to prolong her life as long as possible, but without the amputation. She denies pain, she is sleeping ok. She does have some depression, but has never taken medication for it and does not want to. She states, "I just want to feel normal again". When asked about what "normal" means she states- "people wake up, they feel normal". We discussed the possibility that her current situation will not lead to returning  to "normal". She accepts this.  Discussed Hospice support at discharge. Hospice philosophy of care and services. She is open to this.  Patient did not want family in the room during our discussion. She states she is tired of everyone trying to talk her into a different decision. She did give consent for me to discuss our conversation with her brother, Lennette Bihari, and daughter, Malachy Mood. Met separately with her daughter and brother. They tell me patient has always had a difficult life. She has been depressed since her divorce when children were young. This has increased over the last several months when the patient lost her job. She had become completely withdrawn from family and stopped emailing and calling. She was found in her situation by family who went to check on her and called 911. They believed if family had not found her, that she would have been content to remain at home and die. Patient is very religious and has refused medical care her whole life. They have suspected she had diabetes for some time, and have been trying to get her to seek medical care, but she refused. She has refused medical care for depression as well.  There are concerns about home environment. Her daughter notes her home is unsanitary with animal waste and black mold on the floors, the floor is rotting and there are places where there are holes in the floor. This will need to be considered in the discharge planning.   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 She understands the consequences of her decisions. She is depressed, but has a long pattern of refusing medical care. I do not believe that aggressive medical interventions or declaring her incompetent would improve her quality of life. She would likely continue to decline medical care in the future, and have ongoing unmanaged issues. She does not have suicidal ideations nor immediate wishes to die AEB her desire to continue antibiotic therapy for now.  She desires to have control over her decisions and to have her decisions respected and palliative medicine will support her and work to help increase her quality of life during this time.  Primary Decision Maker PATIENT    SUMMARY OF RECOMMENDATIONS -Continue current level of care -Consult to [facility] NP for wound care -Consult to case management for referral to home hospice services   -PT consult for safety of possible d/c if plan is to home independently -PMT will continue to follow and support holistically  Code Status/Advance Care Planning:  DNR   Palliative Prophylaxis:   Frequent Pain Assessment  Additional Recommendations (Limitations, Scope, Preferences):  No Surgical Procedures  Prognosis:    < 6 months  Discharge Planning: Home with Hospice  Primary Diagnoses: Present on Admission: . Gangrene ([place])   I have reviewed the medical record, interviewed the patient and family, and examined the patient. The following aspects are pertinent.  History reviewed. No pertinent past medical history. Social History   Social History  . Marital status: Divorced    Spouse name: N/A  . Number of children: N/A  . Years of education: N/A   Social History Main Topics  . Smoking status: Former Smoker    Packs/day: 0.75    Years: 40.00  . Smokeless tobacco: Never Used  . Alcohol use No  . Drug use: No  . Sexual activity: Not Asked   Other Topics Concern  . None   Social History Narrative  . None   Family History  Problem Relation Age of Onset  . Lymphoma Mother   . Heart disease Mother   . Prostate cancer Father   . Skin cancer Father   . Stroke Father   . Seizures Father   . Epilepsy Brother   . Prostate cancer Brother    Scheduled Meds: . enoxaparin (LOVENOX) injection  40 mg Subcutaneous Q24H  . insulin aspart  0-5 Units Subcutaneous QHS  . insulin aspart  0-9 Units Subcutaneous TID WC  . insulin glargine  6 Units Subcutaneous Daily   Continuous  Infusions: . ceFEPime (MAXIPIME) IV Stopped (12/13/16 1441)  . metronidazole 500 mg (12/13/16 1524)  . vancomycin Stopped (12/13/16 0520)   PRN Meds:.acetaminophen **OR** acetaminophen Medications Prior to Admission:  Prior to Admission medications   Medication Sig Start Date End Date Taking? Authorizing Provider  Multiple Vitamins-Minerals (MULTIVITAMIN WITH MINERALS) tablet Take 1 tablet by mouth daily.   Yes [provider]   Allergies  Allergen Reactions  . Poison Oak Extract Rash   Review of Systems  Constitutional: Positive for fatigue and unexpected weight change.  Cardiovascular: Negative.   All other systems reviewed and are negative.   Physical Exam  Constitutional:  Appears  stated age  Cardiovascular: Normal rate and regular rhythm.   Pulmonary/Chest: Effort normal and breath sounds normal.  Psychiatric:  Very flat affect   Nursing note and vitals reviewed.   Vital Signs: BP 120/60 (BP Location: Right Arm)   Pulse 78   Temp 98.9 F (37.2 C) (Oral)   Resp 14   Ht _0  (1.778 m)   Wt 67.5 kg (148 lb 13 oz)   LMP  (LMP Unknown)   SpO2 96%   BMI 21.35 kg/m  Pain Assessment: No/denies pain   Pain Score: 0-No pain   SpO2: SpO2: 96 % O2 Device:SpO2: 96 % O2 Flow Rate: .   IO: Intake/output summary:  Intake/Output Summary (Last 24 hours) at 12/13/16 1637 Last data filed at 12/13/16 1500  Gross per 24 hour  Intake             1100 ml  Output              250 ml  Net              850 ml    LBM: Last BM Date: 12/13/16 Baseline Weight: Weight: 72.6 kg (160 lb) Most recent weight: Weight: 67.5 kg (148 lb 13 oz)     Palliative Assessment/Data:PPS: 50%   Flowsheet Rows     Most Recent Value  Intake Tab  Referral Department  Hospitalist  Unit at Time of Referral  Intermediate Care Unit  Palliative Care Primary Diagnosis  Sepsis/Infectious Disease  Date Notified  12/13/16  Palliative Care Type  New Palliative care  Reason for referral   Clarify Goals of Care  Date of Admission  12/11/16  # of days IP prior to Palliative referral  2  Clinical Assessment  Psychosocial & Spiritual Assessment  Palliative Care Outcomes      Thank you for this consult. Palliative medicine will continue to follow and assist as needed.   Time In:1515 Time [id number]:[id number] Time Total: 90 minutes Greater than 50%  of this time was spent counseling and coordinating care related to the above assessment and plan.  Signed by: Mariana Kaufman, AGNP-C Palliative Medicine    Please contact Palliative Medicine Team phone at [phone] for questions and concerns.  For individual provider: See Shea Evans

## 2016-12-13 NOTE — Progress Notes (Signed)
PROGRESS NOTE    Brenda Moore  PZW:[id number] DOB: [birth year] DOA: 12/11/2016 PCP: Patient, No Pcp Per   Brief Narrative: Brenda Moore is a 61 y.o. female with no past medical history presented with dry gangrene and osteomyelitis of her right foot. Patient is obviously very depressed but denies being depressed. No sepsis at this time. Started on empiric antibiotic therapy. Orthopedic surgery consulted and suggesting right limb amputation, however, patient is refusing. Psych consulted for evaluation. Infectious disease consulted since patient is refusing amputation.   Assessment & Plan:   Principal Problem:   Gangrene ([place]) Active Problems:   DKA (diabetic ketoacidoses) ([place])   Osteomyelitis ([facility])   High anion gap metabolic acidosis   Adjustment disorder with depressed mood   Gangrene Diabetic foot ulcer Osteomyelitis Patient refusing amputation at this time. Patient with capacity per discussion with Psychiatry on 5/21. -Continue empiric vancomycin, cefepime, metronidazole; will deescalating if patient continues to decline treatment -nutrition consult -ABIs -infectious disease consult: recommending hospice if she will not undergo amputation -palliative care consult  Diabetic ketoacidosis Diabetes mellitus High anion gap metabolic acidosis New diagnosis. Anion gap closed. Acidosis resolved. Hemoglobin A1C of 14.2 -Continue Lantus 6 units daily -SSI -carb modified diet -diabetic coordinator  Depressed mood Patient denies symptoms of depressed mood or history of depression. On evaluation, patient appears to be having depressed mood. Psychiatry recommending treatment for depression, however patient refusing treatment.   Anemia Stable. Appears secondary to chronic disease. B12 significantly elevated. Initial hemoglobin likely secondary to hemoconcentration in setting of dehydration/DKA -anemia panel.  Hyponatremia Likely secondary to volume depletion. Improving. -oral  hydration   DVT prophylaxis: SCDs, Lovenox Code Status: DNR/DNI Family Communication: None at bedside Disposition Plan: If patient continues to decline care, anticipate discharge in 24-48 hours pending goals of care discussion and outpatient services   Consultants:   Orthopedic surgery, Dr. Marlou Sa  Psychiatry, Dr. Darleene Cleaver  Infectious disease  Procedures:   Insulin drip (5/19>>5/20)  Antimicrobials:  Zosyn (5/19)  Vancomycin (5/19>>  Cefepime (5/19>>  Metronidazole (5/19>>    Subjective: No chest pain or dyspnea. No nausea/vomiting. Afebrile.  Objective: Vitals:   12/12/16 2347 12/13/16 0000 12/13/16 0421 12/13/16 0701  BP:  121/60 121/64 114/63  Pulse: 79 81 77 73  Resp: 16 15 16 15   Temp:  99.4 F (37.4 C) 98.5 F (36.9 C) 98.6 F (37 C)  TempSrc: Oral  Oral Oral  SpO2: 97% 96% 97% 96%  Weight:      Height:        Intake/Output Summary (Last 24 hours) at 12/13/16 1016 Last data filed at 12/13/16 0412  Gross per 24 hour  Intake             1340 ml  Output              450 ml  Net              890 ml   Filed Weights   12/11/16 1440 12/11/16 2030  Weight: 72.6 kg (160 lb) 67.5 kg (148 lb 13 oz)    Examination:  General exam: Appears calm and comfortable Respiratory system: Clear to auscultation. Respiratory effort normal. Cardiovascular system: S1 & S2 heard, RRR. No murmurs. Gastrointestinal system: Abdomen is nondistended, soft and nontender. Normal bowel sounds heard. Central nervous system: Alert and oriented. No focal neurological deficits. Extremities: No edema. No calf tenderness. Right foot is wrapped in gauze that is clean and intact. Left foot with necrotic appearing tissue over MTP joint  Skin: No cyanosis. Psychiatry: Judgement and insight appear impaired. Depressed and flat affect. No suicidal ideation.  Except for deletions, exam unchanged from 12/12/2016   Data Reviewed: I have personally reviewed following labs and imaging  studies  CBC:  Recent Labs Lab 12/11/16 1429 12/12/16 0550 12/13/16 0318  WBC 15.7* 14.1* 12.7*  NEUTROABS 13.2*  --   --   HGB 11.0* 8.2* 8.3*  HCT 33.7* 26.0* 26.1*  MCV 82.2 81.3 81.3  PLT 406* 378 102   Basic Metabolic Panel:  Recent Labs Lab 12/11/16 2235 12/12/16 0550 12/12/16 0932 12/12/16 1451 12/13/16 0318  NA 131*  130* 132* 131* 131* 131*  K 3.5  3.5 3.4* 3.2* 3.4* 4.0  CL 100*  100* 100* 102 103 101  CO2 21*  19* 22 21* 20* 22  GLUCOSE 202*  200* 163* 187* 247* 176*  BUN 15  15 12 11 10 12   CREATININE 0.83  0.83 0.76 0.71 0.88 0.89  CALCIUM 8.5*  8.3* 8.3* 8.1* 8.2* 8.1*   GFR: Estimated Creatinine Clearance: 71.6 mL/min (by C-G formula based on SCr of 0.89 mg/dL). Liver Function Tests:  Recent Labs Lab 12/11/16 1429  AST 29  ALT 39  ALKPHOS 149*  BILITOT 0.9  PROT 7.4  ALBUMIN 1.8*   No results for input(s): LIPASE, AMYLASE in the last 168 hours. No results for input(s): AMMONIA in the last 168 hours. Coagulation Profile: No results for input(s): INR, PROTIME in the last 168 hours. Cardiac Enzymes: No results for input(s): CKTOTAL, CKMB, CKMBINDEX, TROPONINI in the last 168 hours. BNP (last 3 results) No results for input(s): PROBNP in the last 8760 hours. HbA1C:  Recent Labs  12/12/16 1352  HGBA1C 14.2*   CBG:  Recent Labs Lab 12/12/16 1211 12/12/16 1302 12/12/16 1742 12/12/16 2302 12/13/16 0827  GLUCAP 174* 166* 282* 138* 218*   Lipid Profile: No results for input(s): CHOL, HDL, LDLCALC, TRIG, CHOLHDL, LDLDIRECT in the last 72 hours. Thyroid Function Tests: No results for input(s): TSH, T4TOTAL, FREET4, T3FREE, THYROIDAB in the last 72 hours. Anemia Panel:  Recent Labs  12/12/16 1352  VITAMINB12 5,149*  FOLATE 18.4  FERRITIN 941*  TIBC 171*  IRON 16*  RETICCTPCT 1.0   Sepsis Labs:  Recent Labs Lab 12/11/16 1349  LATICACIDVEN 1.50    Recent Results (from the past 240 hour(s))  Blood culture  (routine x 2)     Status: None (Preliminary result)   Collection Time: 12/11/16  2:18 PM  Result Value Ref Range Status   Specimen Description BLOOD LEFT ANTECUBITAL  Final   Special Requests   Final    BOTTLES DRAWN AEROBIC AND ANAEROBIC Blood Culture adequate volume   Culture  Setup Time   Final    GRAM POSITIVE COCCI IN CLUSTERS IN BOTH AEROBIC AND ANAEROBIC BOTTLES CRITICAL RESULT CALLED TO, READ BACK BY AND VERIFIED WITH: C BALL,PHARMD AT 1011 12/12/16 BY L BENFIELD    Culture GRAM POSITIVE COCCI IDENTIFICATION TO FOLLOW   Final   Report Status PENDING  Incomplete  Blood Culture ID Panel (Reflexed)     Status: Abnormal   Collection Time: 12/11/16  2:18 PM  Result Value Ref Range Status   Enterococcus species NOT DETECTED NOT DETECTED Final   Listeria monocytogenes NOT DETECTED NOT DETECTED Final   Staphylococcus species DETECTED (A) NOT DETECTED Final    Comment: Methicillin (oxacillin) resistant coagulase negative staphylococcus. Possible blood culture contaminant (unless isolated from more than one blood culture draw or clinical case  suggests pathogenicity). No antibiotic treatment is indicated for blood  culture contaminants. CRITICAL RESULT CALLED TO, READ BACK BY AND VERIFIED WITH: C BALL,PHARMD AT 1011 12/12/16 BY L BENFIELD    Staphylococcus aureus NOT DETECTED NOT DETECTED Final   Methicillin resistance DETECTED (A) NOT DETECTED Final    Comment: CRITICAL RESULT CALLED TO, READ BACK BY AND VERIFIED WITH: C BALL, PHARMD AT 1011 12/12/16 BY L BENFIELD    Streptococcus species NOT DETECTED NOT DETECTED Final   Streptococcus agalactiae NOT DETECTED NOT DETECTED Final   Streptococcus pneumoniae NOT DETECTED NOT DETECTED Final   Streptococcus pyogenes NOT DETECTED NOT DETECTED Final   Acinetobacter baumannii NOT DETECTED NOT DETECTED Final   Enterobacteriaceae species NOT DETECTED NOT DETECTED Final   Enterobacter cloacae complex NOT DETECTED NOT DETECTED Final   Escherichia  coli NOT DETECTED NOT DETECTED Final   Klebsiella oxytoca NOT DETECTED NOT DETECTED Final   Klebsiella pneumoniae NOT DETECTED NOT DETECTED Final   Proteus species NOT DETECTED NOT DETECTED Final   Serratia marcescens NOT DETECTED NOT DETECTED Final   Haemophilus influenzae NOT DETECTED NOT DETECTED Final   Neisseria meningitidis NOT DETECTED NOT DETECTED Final   Pseudomonas aeruginosa NOT DETECTED NOT DETECTED Final   Candida albicans NOT DETECTED NOT DETECTED Final   Candida glabrata NOT DETECTED NOT DETECTED Final   Candida krusei NOT DETECTED NOT DETECTED Final   Candida parapsilosis NOT DETECTED NOT DETECTED Final   Candida tropicalis NOT DETECTED NOT DETECTED Final  Blood culture (routine x 2)     Status: None (Preliminary result)   Collection Time: 12/11/16  2:35 PM  Result Value Ref Range Status   Specimen Description BLOOD RIGHT FOREARM  Final   Special Requests   Final    BOTTLES DRAWN AEROBIC ONLY Blood Culture adequate volume   Culture NO GROWTH 2 DAYS  Final   Report Status PENDING  Incomplete  MRSA PCR Screening     Status: None   Collection Time: 12/11/16  9:16 PM  Result Value Ref Range Status   MRSA by PCR NEGATIVE NEGATIVE Final    Comment:        The GeneXpert MRSA Assay (FDA approved for NASAL specimens only), is one component of a comprehensive MRSA colonization surveillance program. It is not intended to diagnose MRSA infection nor to guide or monitor treatment for MRSA infections.          Radiology Studies: Dg Foot Complete Right  Result Date: 12/11/2016 CLINICAL DATA:  Necrosis of bilateral feet. EXAM: RIGHT FOOT COMPLETE - 3+ VIEW COMPARISON:  None. FINDINGS: Significant soft tissue gas identified overlying the calcaneus, the lateral midfoot, and numerous toes. There is significant bony destruction associated with the first proximal and distal phalanges. Involvement of the adjacent first metatarsal is also identified. The second through fourth  toes and second through fourth metatarsals demonstrate no fracture or bony erosion. The tarsal bones are normal. Lucency along the posterosuperior calcaneus is suspicious for osteomyelitis based on the lateral view. An MRI could better assess the extent of osteomyelitis. IMPRESSION: 1. Diffuse soft tissue gas consistent with a gas-forming organism. 2. Significant destruction associated with the great toe and adjacent first metatarsal consistent with osteomyelitis. 3. Suspected osteomyelitis along the posterosuperior aspect of the calcaneus. An MRI could better assess the extent of osteomyelitis. Electronically Signed   By: Dorise Bullion III M.D   On: 12/11/2016 14:29        Scheduled Meds: . enoxaparin (LOVENOX) injection  40 mg  Subcutaneous Q24H  . insulin aspart  0-5 Units Subcutaneous QHS  . insulin aspart  0-9 Units Subcutaneous TID WC  . insulin glargine  6 Units Subcutaneous Daily  . living well with diabetes book   Does not apply Once   Continuous Infusions: . ceFEPime (MAXIPIME) IV 1 g (12/13/16 1694)  . metronidazole Stopped (12/13/16 0916)  . vancomycin Stopped (12/13/16 0520)     LOS: 2 days     Cordelia Poche, MD [facility] 12/13/2016, 10:16 AM Pager: [phone]  If 7PM-7AM, please contact night-coverage [email or web address] Password [email or web address] 12/13/2016, 10:16 AM

## 2016-12-14 ENCOUNTER — Inpatient Hospital Stay (HOSPITAL_COMMUNITY): Payer: BLUE CROSS/BLUE SHIELD

## 2016-12-14 DIAGNOSIS — E46 Unspecified protein-calorie malnutrition: Secondary | ICD-10-CM

## 2016-12-14 DIAGNOSIS — E11621 Type 2 diabetes mellitus with foot ulcer: Secondary | ICD-10-CM

## 2016-12-14 DIAGNOSIS — R5382 Chronic fatigue, unspecified: Secondary | ICD-10-CM

## 2016-12-14 DIAGNOSIS — Z515 Encounter for palliative care: Secondary | ICD-10-CM

## 2016-12-14 DIAGNOSIS — Z7189 Other specified counseling: Secondary | ICD-10-CM

## 2016-12-14 LAB — BASIC METABOLIC PANEL
ANION GAP: 8 (ref 5–15)
BUN: 9 mg/dL (ref 6–20)
CHLORIDE: 101 mmol/L (ref 101–111)
CO2: 20 mmol/L — AB (ref 22–32)
Calcium: 8 mg/dL — ABNORMAL LOW (ref 8.9–10.3)
Creatinine, Ser: 0.84 mg/dL (ref 0.44–1.00)
Glucose, Bld: 199 mg/dL — ABNORMAL HIGH (ref 65–99)
Potassium: 3.4 mmol/L — ABNORMAL LOW (ref 3.5–5.1)
SODIUM: 129 mmol/L — AB (ref 135–145)

## 2016-12-14 LAB — SODIUM, URINE, RANDOM: SODIUM UR: 55 mmol/L

## 2016-12-14 LAB — URINE CULTURE: Culture: NO GROWTH

## 2016-12-14 LAB — GLUCOSE, CAPILLARY
GLUCOSE-CAPILLARY: 143 mg/dL — AB (ref 65–99)
Glucose-Capillary: 170 mg/dL — ABNORMAL HIGH (ref 65–99)
Glucose-Capillary: 252 mg/dL — ABNORMAL HIGH (ref 65–99)
Glucose-Capillary: 195 mg/dL — ABNORMAL HIGH (ref 65–99)

## 2016-12-14 LAB — OSMOLALITY, URINE: Osmolality, Ur: 310 mOsm/kg (ref 300–900)

## 2016-12-14 MED ORDER — CIPROFLOXACIN HCL 500 MG PO TABS
500.0000 mg | ORAL_TABLET | Freq: Two times a day (BID) | ORAL | Status: DC
  Administered 2016-12-14 – 2016-12-17 (×7): 500 mg via ORAL
  Filled 2016-12-14 (×7): qty 1

## 2016-12-14 MED ORDER — DOXYCYCLINE HYCLATE 100 MG PO TABS
100.0000 mg | ORAL_TABLET | Freq: Two times a day (BID) | ORAL | Status: DC
  Administered 2016-12-14 – 2016-12-17 (×7): 100 mg via ORAL
  Filled 2016-12-14 (×7): qty 1

## 2016-12-14 MED ORDER — ONDANSETRON HCL 4 MG/2ML IJ SOLN: 4.0000 mg | Freq: Four times a day (QID) | INTRAMUSCULAR | Status: DC | PRN

## 2016-12-14 MED ORDER — VENLAFAXINE HCL 37.5 MG PO TABS
37.5000 mg | ORAL_TABLET | Freq: Two times a day (BID) | ORAL | Status: DC
  Administered 2016-12-14 – 2016-12-17 (×7): 37.5 mg via ORAL
  Filled 2016-12-14 (×7): qty 1

## 2016-12-14 MED ORDER — MORPHINE SULFATE 15 MG PO TABS
7.5000 mg | ORAL_TABLET | ORAL | Status: DC | PRN
  Administered 2016-12-14: 7.5 mg via ORAL
  Filled 2016-12-14: qty 1

## 2016-12-14 MED ORDER — METRONIDAZOLE 500 MG PO TABS
500.0000 mg | ORAL_TABLET | Freq: Three times a day (TID) | ORAL | Status: DC
  Administered 2016-12-14 – 2016-12-17 (×10): 500 mg via ORAL
  Filled 2016-12-14 (×10): qty 1

## 2016-12-14 MED ORDER — METHYLPHENIDATE HCL 5 MG PO TABS
5.0000 mg | ORAL_TABLET | Freq: Every morning | ORAL | Status: DC
  Administered 2016-12-14 – 2016-12-17 (×2): 5 mg via ORAL
  Filled 2016-12-14 (×3): qty 1

## 2016-12-14 MED ORDER — MORPHINE SULFATE (PF) 4 MG/ML IV SOLN
1.0000 mg | INTRAVENOUS | Status: DC | PRN
  Administered 2016-12-14: 1 mg via INTRAVENOUS
  Filled 2016-12-14: qty 1

## 2016-12-14 MED ORDER — INSULIN GLARGINE 100 UNIT/ML ~~LOC~~ SOLN
8.0000 [IU] | Freq: Every day | SUBCUTANEOUS | Status: DC
  Administered 2016-12-15 – 2016-12-17 (×3): 8 [IU] via SUBCUTANEOUS
  Filled 2016-12-14 (×3): qty 0.08

## 2016-12-14 NOTE — Progress Notes (Signed)
Per family request (son), met w/ patient at bedside._0  Introduced Art gallery manager.  Will follow, as needed.  - Rev. [place] MDiv ThM

## 2016-12-14 NOTE — Progress Notes (Signed)
Pt was shown how to draw insulin from the vial and instructed on how to administer. Will continue to work with her on insulin teaching

## 2016-12-14 NOTE — Consult Note (Signed)
WOC reviewed records, Xray indicates gas forming gangrene and possible osteomyelitis.  Patient has been seen by orthopedics and has refused any surgical intervention.  I have reviewed images in the chart.  Feel that keeping the right foot stable with current eschar would be most palliative type of treatment.  Left foot with callous and poor hygiene.  Would recommend to paint the right affected foot with betadine daily, allow to air dry.  This will provide antibacterial effects and serve to dry area.  Dry dressing can be applied if patient desires.  Left foot need to have extensive cleansing to determine if any further intervention needed.  Discussed care with hospitalist.    Re consult if needed, will not follow at this time. Thanks  Govind Furey [employer], RN,CWOCN, CNS [phone])

## 2016-12-14 NOTE — Progress Notes (Signed)
VASCULAR LAB PRELIMINARY  ARTERIAL  ABI completed: Right ABI of 1.15 and 1.11 is suggestive of arterial flow within normal limits at rest.   RIGHT    LEFT    PRESSURE WAVEFORM  PRESSURE WAVEFORM  BRACHIAL 137 Triphasic BRACHIAL 135 Triphasic  DP 158 Biphasic DP 151 Triphasic  PT 143 Biphasic PT 152 Triphasic    RIGHT LEFT  ABI 1.15 1.11     Legrand Como, RVT 12/14/2016, 11:35 AM

## 2016-12-14 NOTE — Evaluation (Signed)
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 Pt advised of benefit of amputation in decreasing the risk of infection spreading. Pt has refused and is currently seeking palliative care. PMH significant for DKA, DM, depression and adjustment disorder  Clinical Impression  Pt admitted with above diagnosis. Pt currently with functional limitations due to the deficits listed below (see PT Problem List). Pt is min guard for bed mobility, minAx1 for transfers with RW, minguard for ambulation of 5 feet with RW. Pt limited by fatigue and lethargy during session. Pt will benefit from skilled PT to increase their independence and safety with mobility to allow discharge to the venue listed below.       Follow Up Recommendations Supervision - Intermittent;SNF    Equipment Recommendations  Rolling walker with 5" wheels;3in1 (PT)    Recommendations for Other Services OT consult     Precautions / Restrictions Restrictions Weight Bearing Restrictions: No      Mobility  Bed Mobility Overal bed mobility: Needs Assistance Bed Mobility: Supine to Sit     Supine to sit: HOB elevated;Min guard     General bed mobility comments: vc for hand placement on bedrails and scooting to EoB  Transfers Overall transfer level: Needs assistance Equipment used: Rolling walker (2 wheeled) Transfers: Sit to/from Stand Sit to Stand: Min assist         General transfer comment: minA for power up and steadying. vc for hand placement and steadying with RW  Ambulation/Gait Ambulation/Gait assistance: Min guard Ambulation Distance (Feet): 5 Feet Assistive device: Rolling walker (2 wheeled) Gait Pattern/deviations: Step-to pattern;Shuffle;Decreased stance time - right;Decreased weight shift to right;Antalgic Gait velocity:  slowed Gait velocity interpretation: Below normal speed for age/gender General Gait Details: antalgic gait with decreased weightbearing through R LE vc for walker sequencing and increased weightbearing through UE to advance L LE  Stairs            Wheelchair Mobility    Modified Rankin (Stroke Patients Only)       Balance Overall balance assessment: Needs assistance Sitting-balance support: No upper extremity supported;Feet unsupported Sitting balance-Leahy Scale: Fair Sitting balance - Comments: able to balance EoB   Standing balance support: Bilateral upper extremity supported Standing balance-Leahy Scale: Fair Standing balance comment: requires RW support                             Pertinent Vitals/Pain Pain Assessment: 0-10 Pain Score: 3  Pain Location: R foot Pain Descriptors / Indicators: Constant;Burning Pain Intervention(s): Monitored during session;Premedicated before session    Home Living Family/patient expects to be discharged to:: Private residence Living Arrangements: Alone   Type of Home: House Home Access: Stairs to enter Entrance Stairs-Rails: None Technical brewer of Steps: 10 Home Layout: Two level;Bed/bath upstairs        Prior Function Level of Independence: Needs assistance   Gait / Transfers Assistance Needed: able to do limited ambulation in house  ADL's / Homemaking Assistance Needed: last 2 weeks living on food from freezer        Hand Dominance        Extremity/Trunk Assessment   Upper Extremity Assessment Upper Extremity Assessment: Overall WFL for tasks assessed    Lower Extremity Assessment Lower Extremity Assessment: RLE deficits/detail RLE Deficits /  Details: decreased foot and ankle ROM, R knee and hip strength grossly 3+/5 and ROM WFL RLE: Unable to fully assess due to pain;Unable to fully assess due to immobilization RLE Sensation: decreased light touch (to midway up R calf)    Cervical /  Trunk Assessment Cervical / Trunk Assessment: Normal  Communication   Communication: No difficulties  Cognition Arousal/Alertness: Lethargic Behavior During Therapy: Flat affect Overall Cognitive Status: No family/caregiver present to determine baseline cognitive functioning                                 General Comments: limited answers during interview      General Comments General comments (skin integrity, edema, etc.): Pt with c/o of severe fatigue and not wanting to get out of bed    Exercises     Assessment/Plan    PT Assessment Patient needs continued PT services  PT Problem List Decreased strength;Decreased range of motion;Decreased activity tolerance;Decreased balance;Decreased knowledge of use of DME;Decreased safety awareness;Pain;Impaired sensation       PT Treatment Interventions DME instruction;Gait training;Stair training;Functional mobility training;Therapeutic activities;Therapeutic exercise;Balance training;Patient/family education    PT Goals (Current goals can be found in the Care Plan section)  Acute Rehab PT Goals Patient Stated Goal: go home PT Goal Formulation: With patient Time For Goal Achievement: 12/28/16 Potential to Achieve Goals: Poor    Frequency Min 3X/week   Barriers to discharge Inaccessible home environment;Decreased caregiver support pt has between 10-15 steps to reach her living area and is currently too weak to safely get into her house       AM-PAC PT "6 Clicks" Daily Activity  Outcome Measure Difficulty turning over in bed (including adjusting bedclothes, sheets and blankets)?: A Lot Difficulty moving from lying on back to sitting on the side of the bed? : A Lot Difficulty sitting down on and standing up from a chair with arms (e.g., wheelchair, bedside commode, etc,.)?: A Lot Help needed moving to and from a bed to chair (including a wheelchair)?: A Little Help needed walking in hospital room?: A Little Help  needed climbing 3-5 steps with a railing? : Total 6 Click Score: 13    End of Session Equipment Utilized During Treatment: Gait belt Activity Tolerance: Patient limited by fatigue;Patient limited by lethargy Patient left: in chair;with call bell/phone within reach;with chair alarm set Nurse Communication: Mobility status PT Visit Diagnosis: Unsteadiness on feet (R26.81);Other abnormalities of gait and mobility (R26.89);Muscle weakness (generalized) (M62.81);Pain Pain - Right/Left: Right Pain - part of body: Ankle and joints of foot    Time: 1115-5208 PT Time Calculation (min) (ACUTE ONLY): 35 min   Charges:   PT Evaluation $PT Eval Low Complexity: 1 Procedure PT Treatments $Gait Training: 8-22 mins   PT G Codes:        Shamariah Shewmake B. Migdalia Dk PT, DPT Acute Rehabilitation  [phone] Pager [phone]    [place] 12/14/2016, 10:07 AM

## 2016-12-14 NOTE — Progress Notes (Signed)
PROGRESS NOTE    Brenda Moore  ZOX:[id number] DOB: September 27, 1955 DOA: 12/11/2016 PCP: Patient, No Pcp Per   Brief Narrative: Brenda Moore is a 61 y.o. female with no past medical history presented with dry gangrene and osteomyelitis of her right foot. Patient is obviously very depressed but denies being depressed. No sepsis at this time. Started on empiric antibiotic therapy. Orthopedic surgery consulted and suggesting right limb amputation, however, patient is refusing. Psych consulted for evaluation. Infectious disease consulted since patient is refusing amputation.   Assessment & Plan:   Principal Problem:   Gangrene ([place]) Active Problems:   DKA (diabetic ketoacidoses) ([place])   Osteomyelitis ([facility])   High anion gap metabolic acidosis   Adjustment disorder with depressed mood   Bacteremia   Advance care planning   Palliative care by specialist   Goals of care, counseling/discussion   Protein-calorie malnutrition ([place])   Chronic fatigue   Gangrene Diabetic foot ulcer Osteomyelitis Patient continues to refuse amputation at this time. Patient with capacity per discussion with Psychiatry on 5/21 and patient has capacity. -Transition to doxycycline, cipro and flagyl x2-4 weeks per ID recommendations -nutrition recommendations -palliative care consult: Hospice -PT: SNF  Diabetic ketoacidosis Diabetes mellitus High anion gap metabolic acidosis New diagnosis. Anion gap closed. Acidosis resolved. Hemoglobin A1C of 14.2 -Increase to Lantus 8 units daily -SSI -carb modified diet -diabetic coordinator  Depressed mood Patient denies symptoms of depressed mood or history of depression. On evaluation, patient appears to be having depressed mood. Psychiatry recommending treatment for depression, however patient refusing treatment.   Anemia Stable. Appears secondary to chronic disease. B12 significantly elevated. Initial hemoglobin likely secondary to hemoconcentration in setting of  dehydration/DKA  Hyponatremia Hydrated and still low. Possible this is chronic. -oral hydration -urine/serum osmolality -urine sodium   DVT prophylaxis: SCDs, Lovenox Code Status: DNR/DNI Family Communication: None at bedside Disposition Plan: Discharge to SNF with hospice   Consultants:   Orthopedic surgery, Dr. Marlou Sa  Psychiatry, Dr. Darleene Cleaver  Infectious disease  Procedures:   Insulin drip (5/19>>5/20)  Antimicrobials:  Zosyn (5/19)  Vancomycin (5/19>>5/22)  Cefepime (5/19>>5/22)  Metronidazole (5/19>>  Doxycycline (5/22>>  Ciprofloxacin (5/22>>   Subjective: Pain is controlled. No chills or fevers overnight.  Objective: Vitals:   12/13/16 1100 12/13/16 1835 12/13/16 2106 12/14/16 0438  BP: 120/60 (!) 111/56 125/61 118/61  Pulse: 78 73 86 88  Resp: 14 16 19 19   Temp: 98.9 F (37.2 C) 98.2 F (36.8 C) 98.1 F (36.7 C) 98.3 F (36.8 C)  TempSrc: Oral Oral Oral Oral  SpO2: 96% 97% 97% 98%  Weight:      Height:        Intake/Output Summary (Last 24 hours) at 12/14/16 1207 Last data filed at 12/14/16 0944  Gross per 24 hour  Intake              770 ml  Output              625 ml  Net              145 ml   Filed Weights   12/11/16 1440 12/11/16 2030  Weight: 72.6 kg (160 lb) 67.5 kg (148 lb 13 oz)    Examination:  General exam: Appears calm and comfortable Respiratory system: Clear to auscultation. Respiratory effort normal. Cardiovascular system: S1 & S2 heard, RRR. No murmurs. Gastrointestinal system: Abdomen is nondistended, soft and nontender. Normal bowel sounds heard. Central nervous system: Alert and oriented. No focal neurological deficits. Extremities:  No edema. No calf tenderness. Right foot is wrapped in gauze that is clean and intact. Left foot with necrotic appearing tissue over MTP joint Skin: No cyanosis. Psychiatry: Judgement and insight appear impaired. Depressed and flat affect. No suicidal ideation.  Except for  deletions, exam unchanged from 12/13/2016   Data Reviewed: I have personally reviewed following labs and imaging studies  CBC:  Recent Labs Lab 12/11/16 1429 12/12/16 0550 12/13/16 0318  WBC 15.7* 14.1* 12.7*  NEUTROABS 13.2*  --   --   HGB 11.0* 8.2* 8.3*  HCT 33.7* 26.0* 26.1*  MCV 82.2 81.3 81.3  PLT 406* 378 160   Basic Metabolic Panel:  Recent Labs Lab 12/12/16 0550 12/12/16 0932 12/12/16 1451 12/13/16 0318 12/14/16 0607  NA 132* 131* 131* 131* 129*  K 3.4* 3.2* 3.4* 4.0 3.4*  CL 100* 102 103 101 101  CO2 22 21* 20* 22 20*  GLUCOSE 163* 187* 247* 176* 199*  BUN 12 11 10 12 9   CREATININE 0.76 0.71 0.88 0.89 0.84  CALCIUM 8.3* 8.1* 8.2* 8.1* 8.0*   GFR: Estimated Creatinine Clearance: 75.9 mL/min (by C-G formula based on SCr of 0.84 mg/dL). Liver Function Tests:  Recent Labs Lab 12/11/16 1429  AST 29  ALT 39  ALKPHOS 149*  BILITOT 0.9  PROT 7.4  ALBUMIN 1.8*   No results for input(s): LIPASE, AMYLASE in the last 168 hours. No results for input(s): AMMONIA in the last 168 hours. Coagulation Profile: No results for input(s): INR, PROTIME in the last 168 hours. Cardiac Enzymes: No results for input(s): CKTOTAL, CKMB, CKMBINDEX, TROPONINI in the last 168 hours. BNP (last 3 results) No results for input(s): PROBNP in the last 8760 hours. HbA1C:  Recent Labs  12/12/16 1352  HGBA1C 14.2*   CBG:  Recent Labs Lab 12/13/16 1127 12/13/16 1737 12/13/16 2204 12/14/16 0758 12/14/16 1158  GLUCAP 321* 176* 282* 170* 252*   Lipid Profile: No results for input(s): CHOL, HDL, LDLCALC, TRIG, CHOLHDL, LDLDIRECT in the last 72 hours. Thyroid Function Tests: No results for input(s): TSH, T4TOTAL, FREET4, T3FREE, THYROIDAB in the last 72 hours. Anemia Panel:  Recent Labs  12/12/16 1352  VITAMINB12 5,149*  FOLATE 18.4  FERRITIN 941*  TIBC 171*  IRON 16*  RETICCTPCT 1.0   Sepsis Labs:  Recent Labs Lab 12/11/16 1349  LATICACIDVEN 1.50     Recent Results (from the past 240 hour(s))  Blood culture (routine x 2)     Status: Abnormal   Collection Time: 12/11/16  2:18 PM  Result Value Ref Range Status   Specimen Description BLOOD LEFT ANTECUBITAL  Final   Special Requests   Final    BOTTLES DRAWN AEROBIC AND ANAEROBIC Blood Culture adequate volume   Culture  Setup Time   Final    GRAM POSITIVE COCCI IN CLUSTERS IN BOTH AEROBIC AND ANAEROBIC BOTTLES CRITICAL RESULT CALLED TO, READ BACK BY AND VERIFIED WITH: C BALL,PHARMD AT 1011 12/12/16 BY L BENFIELD    Culture STAPHYLOCOCCUS SPECIES (COAGULASE NEGATIVE) (A)  Final   Report Status 12/14/2016 FINAL  Final   Organism ID, Bacteria STAPHYLOCOCCUS SPECIES (COAGULASE NEGATIVE)  Final      Susceptibility   Staphylococcus species (coagulase negative) - MIC*    CIPROFLOXACIN <=0.5 SENSITIVE Sensitive     ERYTHROMYCIN >=8 RESISTANT Resistant     GENTAMICIN <=0.5 SENSITIVE Sensitive     OXACILLIN <=0.25 SENSITIVE Sensitive     TETRACYCLINE <=1 SENSITIVE Sensitive     VANCOMYCIN 1 SENSITIVE Sensitive  TRIMETH/SULFA <=10 SENSITIVE Sensitive     CLINDAMYCIN <=0.25 SENSITIVE Sensitive     RIFAMPIN <=0.5 SENSITIVE Sensitive     Inducible Clindamycin NEGATIVE Sensitive     * STAPHYLOCOCCUS SPECIES (COAGULASE NEGATIVE)  Blood Culture ID Panel (Reflexed)     Status: Abnormal   Collection Time: 12/11/16  2:18 PM  Result Value Ref Range Status   Enterococcus species NOT DETECTED NOT DETECTED Final   Listeria monocytogenes NOT DETECTED NOT DETECTED Final   Staphylococcus species DETECTED (A) NOT DETECTED Final    Comment: Methicillin (oxacillin) resistant coagulase negative staphylococcus. Possible blood culture contaminant (unless isolated from more than one blood culture draw or clinical case suggests pathogenicity). No antibiotic treatment is indicated for blood  culture contaminants. CRITICAL RESULT CALLED TO, READ BACK BY AND VERIFIED WITH: C BALL,PHARMD AT 1011 12/12/16 BY L  BENFIELD    Staphylococcus aureus NOT DETECTED NOT DETECTED Final   Methicillin resistance DETECTED (A) NOT DETECTED Final    Comment: CRITICAL RESULT CALLED TO, READ BACK BY AND VERIFIED WITH: C BALL, PHARMD AT 1011 12/12/16 BY L BENFIELD    Streptococcus species NOT DETECTED NOT DETECTED Final   Streptococcus agalactiae NOT DETECTED NOT DETECTED Final   Streptococcus pneumoniae NOT DETECTED NOT DETECTED Final   Streptococcus pyogenes NOT DETECTED NOT DETECTED Final   Acinetobacter baumannii NOT DETECTED NOT DETECTED Final   Enterobacteriaceae species NOT DETECTED NOT DETECTED Final   Enterobacter cloacae complex NOT DETECTED NOT DETECTED Final   Escherichia coli NOT DETECTED NOT DETECTED Final   Klebsiella oxytoca NOT DETECTED NOT DETECTED Final   Klebsiella pneumoniae NOT DETECTED NOT DETECTED Final   Proteus species NOT DETECTED NOT DETECTED Final   Serratia marcescens NOT DETECTED NOT DETECTED Final   Haemophilus influenzae NOT DETECTED NOT DETECTED Final   Neisseria meningitidis NOT DETECTED NOT DETECTED Final   Pseudomonas aeruginosa NOT DETECTED NOT DETECTED Final   Candida albicans NOT DETECTED NOT DETECTED Final   Candida glabrata NOT DETECTED NOT DETECTED Final   Candida krusei NOT DETECTED NOT DETECTED Final   Candida parapsilosis NOT DETECTED NOT DETECTED Final   Candida tropicalis NOT DETECTED NOT DETECTED Final  Blood culture (routine x 2)     Status: None (Preliminary result)   Collection Time: 12/11/16  2:35 PM  Result Value Ref Range Status   Specimen Description BLOOD RIGHT FOREARM  Final   Special Requests   Final    BOTTLES DRAWN AEROBIC ONLY Blood Culture adequate volume   Culture NO GROWTH 2 DAYS  Final   Report Status PENDING  Incomplete  MRSA PCR Screening     Status: None   Collection Time: 12/11/16  9:16 PM  Result Value Ref Range Status   MRSA by PCR NEGATIVE NEGATIVE Final    Comment:        The GeneXpert MRSA Assay (FDA approved for NASAL  specimens only), is one component of a comprehensive MRSA colonization surveillance program. It is not intended to diagnose MRSA infection nor to guide or monitor treatment for MRSA infections.   Culture, Urine     Status: None   Collection Time: 12/12/16 10:28 AM  Result Value Ref Range Status   Specimen Description URINE, RANDOM  Final   Special Requests NONE  Final   Culture NO GROWTH  Final   Report Status 12/14/2016 FINAL  Final         Radiology Studies: No results found.      Scheduled Meds: . enoxaparin (LOVENOX) injection  40 mg Subcutaneous Q24H  . insulin aspart  0-5 Units Subcutaneous QHS  . insulin aspart  0-9 Units Subcutaneous TID WC  . insulin glargine  6 Units Subcutaneous Daily  . methylphenidate  5 mg Oral q morning - 10a  . venlafaxine  37.5 mg Oral BID WC   Continuous Infusions: . ceFEPime (MAXIPIME) IV Stopped (12/14/16 0840)  . metronidazole Stopped (12/14/16 0800)  . vancomycin Stopped (12/14/16 0506)     LOS: 3 days     Cordelia Poche, MD [facility] 12/14/2016, 12:07 PM Pager: [phone]  If 7PM-7AM, please contact night-coverage [email or web address] Password [facility] 12/14/2016, 12:07 PM

## 2016-12-14 NOTE — Care Management Note (Addendum)
Case Management Note  Patient Details  Name: Brenda Moore MRN: [id number] Date of Birth: April 01, 1956  Subjective/Objective:                    Action/Plan:  Consult for medication assistance. Confirmed with patient she does have BCBS , unable to assist with co pays.   Patient does not have PCP. Explained to patient she can call number on her [facility] card and be provided with a full list of MD's that are in network with her plan. Also , if patient knows a MD she would like to establish care with she can call that office directly and see if that MD is accepting new patients with her [place].   Patient voiced understanding to all of the above.    PT recommending SNF.  Spoke with patient . Patient is open to home with palliative following, however she lives alone and per PT note she ambulated 5 feet with assistance.See palliative note for condition of home.  PT recommendation SNF. Patient states she would be willing to go to a SNF if insurance would approve.   SW [place] aware and will talk with patient.  Expected Discharge Date:  12/18/16               Expected Discharge Plan:  Home/Self Care  In-House Referral:     Discharge planning Services  CM Consult, Medication Assistance  Post Acute Care Choice:    Choice offered to:  Patient  DME Arranged:    DME Agency:     HH Arranged:    [place] Agency:     Status of Service:  In process, will continue to follow  If discussed at Long Length of Stay Meetings, dates discussed:    Additional Comments:  Marilu Favre, RN 12/14/2016, 8:57 AM

## 2016-12-14 NOTE — Progress Notes (Signed)
   12/14/16 1300  Clinical Encounter Type  Visited With Health care provider;Other (Comment)  Visit Type Follow-up;Spiritual support  Referral From Nurse;Palliative care team  Consult/Referral To Chaplain  Spiritual Encounters  Spiritual Needs Emotional  Stress Factors  Family Stress Factors Not reviewed    Follow up on patient from palliative rounds. Left message for son who asked for chaplain to call him. Can not discuss patient or medical information with him, but willing to hear his family of origin information. Marycatherine Maniscalco L. Volanda Napoleon, MDiv

## 2016-12-14 NOTE — Progress Notes (Signed)
Pharmacy Antibiotic Note  Brenda Moore is a 61 y.o. female admitted on 12/11/2016 with necrotic bilateral feet.  Pharmacy consulted for vancomycin and cefepime.  Day #4 of abx for necrotic bilateral feet with suspected osteo. Imaging: gas-forming organism. Ortho and ID consulted and recommends below-the-knee amputation but patient currently refusing. ID recommends oral abx and hospice if continues to refuse.  Plan: Continue vancomycin 1g IV q12h Continue Zosyn 3.375gm IV q8h Continue Flagyl 500mg  IV Q8h per MD Monitor clinical picture, renal function, VT prn F/U abx deescalation / LOT  Consider transition to PO abx soon if continues to refuse amputation?  Height: 5\' 10"  (177.8 cm) Weight: 148 lb 13 oz (67.5 kg) IBW/kg (Calculated) : 68.5  Temp (24hrs), Avg:98.4 F (36.9 C), Min:98.1 F (36.7 C), Max:98.9 F (37.2 C)   Recent Labs Lab 12/11/16 1349 12/11/16 1429  12/12/16 0550 12/12/16 0932 12/12/16 1451 12/13/16 0318 12/14/16 0607  WBC  --  15.7*  --  14.1*  --   --  12.7*  --   CREATININE  --  1.07*  < > 0.76 0.71 0.88 0.89 0.84  LATICACIDVEN 1.50  --   --   --   --   --   --   --   < > = values in this interval not displayed.  Estimated Creatinine Clearance: 75.9 mL/min (by C-G formula based on SCr of 0.84 mg/dL).    Allergies  Allergen Reactions  . [place], PharmD, [facility] Clinical Pharmacist Pager [phone] 12/14/2016 8:20 AM

## 2016-12-14 NOTE — Progress Notes (Signed)
Inpatient Diabetes Program Recommendations  AACE/ADA: New Consensus Statement on Inpatient Glycemic Control (2015)  Target Ranges:  Prepandial:   less than 140 mg/dL      Peak postprandial:   less than 180 mg/dL (1-2 hours)      Critically ill patients:  140 - 180 mg/dL   Lab Results  Component Value Date   GLUCAP 252 (H) 12/14/2016   HGBA1C 14.2 (H) 12/12/2016    Review of Glycemic Control Results for JOUA, BAKE (MRN [id number]) as of 12/14/2016 12:22  Ref. Range 12/13/2016 11:27 12/13/2016 17:37 12/13/2016 22:04 12/14/2016 07:58 12/14/2016 11:58  Glucose-Capillary Latest Ref Range: 65 - 99 mg/dL 321 (H) 176 (H) 282 (H) 170 (H) 252 (H)   Inpatient Diabetes Program Recommendations:  Noted post prandial CBGs elevated. Please consider Novolog 3-4 units tid meal coverage if eats 50%. Spoke with patient concerning need for insulin. Patient willing to learn to administer insulin injections. Spoke with RN Daria Pastures and plans to start insulin injection teaching with patient. Will follow.  Thank you, [name]. Deaun Rocha, RN, MSN, CDE  Diabetes Coordinator Inpatient Glycemic Control Team Team Pager [phone] (8am-5pm) 12/14/2016 12:25 PM

## 2016-12-14 NOTE — Progress Notes (Signed)
Daily Progress Note   Patient Name: Brenda Moore       Date: 12/14/2016 DOB: [birth year]  Age: 61 y.o. MRN#: [id number] Attending Physician: Mariel Aloe, MD Primary Care Physician: Patient, No Pcp Per Admit Date: 12/11/2016  Reason for Consultation/Follow-up: Establishing goals of care and Non pain symptom management  Subjective: Patient sitting up in chair. States she's feeling better this morning, but sleepy. Feels she needs a nap. Took some morphine for pain this morning.  Discussed her expectations coming into the hospital. She expected to "get some rest" and "not have to be able to do things for myself". She is surprised by how things have turned out.  She acknowledges depression. We again discussed her goal of "feeling normal". We discussed how far she is willing to go to reach that goal- she is willing to take medicine, she will consider continued medical follow-up if she can afford it- mostly through Hospice at home. She has had financial difficulties since losing her job. She knows that surgery would help her reach this goal, but she does not want to have surgery- she does know that she can reverse this decision if she chooses.    Review of Systems  Constitutional: Positive for malaise/fatigue and weight loss.  Neurological: Positive for weakness.  Psychiatric/Behavioral: Positive for depression. Negative for suicidal ideas. The patient is not nervous/anxious and does not have insomnia.   All other systems reviewed and are negative.   Length of Stay: 3  Current Medications: Scheduled Meds:  . enoxaparin (LOVENOX) injection  40 mg Subcutaneous Q24H  . insulin aspart  0-5 Units Subcutaneous QHS  . insulin aspart  0-9 Units Subcutaneous TID WC  . insulin glargine  6 Units  Subcutaneous Daily  . methylphenidate  5 mg Oral q morning - 10a  . venlafaxine  37.5 mg Oral BID WC    Continuous Infusions: . ceFEPime (MAXIPIME) IV Stopped (12/14/16 0840)  . metronidazole Stopped (12/14/16 0800)  . vancomycin Stopped (12/14/16 0506)    PRN Meds: acetaminophen **OR** acetaminophen, morphine, morphine injection  Physical Exam  Constitutional: She is oriented to person, place, and time.  Cardiovascular: Normal rate and regular rhythm.   Pulmonary/Chest: Effort normal and breath sounds normal.  Musculoskeletal: Normal range of motion.  Neurological: She is alert and oriented to person, place, and  time.  Skin:  Left foot with dressing in place, black toes with bloody drainage and gangrenous odor noted  Psychiatric: Judgment and thought content normal.  Flat affect            Vital Signs: BP 118/61 (BP Location: Left Arm)   Pulse 88   Temp 98.3 F (36.8 C) (Oral)   Resp 19   Ht 5\' 10"  (1.778 m)   Wt 67.5 kg (148 lb 13 oz)   LMP  (LMP Unknown)   SpO2 98%   BMI 21.35 kg/m  SpO2: SpO2: 98 % O2 Device: O2 Device: Not Delivered O2 Flow Rate:    Intake/output summary:  Intake/Output Summary (Last 24 hours) at 12/14/16 1032 Last data filed at 12/14/16 0944  Gross per 24 hour  Intake              770 ml  Output              625 ml  Net              145 ml   LBM: Last BM Date: 12/11/16 Baseline Weight: Weight: 72.6 kg (160 lb) Most recent weight: Weight: 67.5 kg (148 lb 13 oz)       Palliative Assessment/Data: 50%    Flowsheet Rows     Most Recent Value  Intake Tab  Referral Department  Hospitalist  Unit at Time of Referral  Intermediate Care Unit  Palliative Care Primary Diagnosis  Sepsis/Infectious Disease  Date Notified  12/13/16  Palliative Care Type  New Palliative care  Reason for referral  Clarify Goals of Care  Date of Admission  12/11/16  # of days IP prior to Palliative referral  2  Clinical Assessment  Psychosocial & Spiritual  Assessment  Palliative Care Outcomes      Patient Active Problem List   Diagnosis Date Noted  . Advance care planning   . Palliative care by specialist   . Goals of care, counseling/discussion   . Bacteremia   . Adjustment disorder with depressed mood 12/12/2016  . Gangrene ([place]) 12/11/2016  . DKA (diabetic ketoacidoses) ([place]) 12/11/2016  . Osteomyelitis ([place]) 12/11/2016  . High anion gap metabolic acidosis 62/37/6283    Palliative Care Assessment & Plan   Patient Profile: 61 y.o. female  with no past medical history admitted on 12/11/2016 with   Bilateral wounds to feet. 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 She notes ongoing fatigue and feeling "not normal" for a long time. Surgery has recommended amputation of R foot, however, patient has very clearly declined surgery. Palliative medicine consulted for [place].   Assessment/Recommendations/Plan   Gangrene- continue current level of care w/ antibiotics  Pain- morphine IR po 7.5mg  q4hr prn  Depression- venlafaxine 37.5mg  po bid and methylphenidate 5mg  qam  Severe protein malnutrition (albumin 1.8)- indicates high likelihood of mortality, high risk of poor healing with amputation so palliative choice is appropriate in this situation  D/C home (or SNF) with Hospice after current round of antibiotic therapy is complete  Goals of Care and Additional Recommendations:  Limitations on Scope of Treatment: No Surgical Procedures  Code Status:  DNR  Prognosis:   < 6 months  Discharge Planning:  To Be Determined  Care plan was discussed with patient.  Thank you for allowing the Palliative Medicine Team to assist in the care of this patient.   Time In: 1000 Time Out: 1045 Total Time  45 mins Prolonged Time Billed No      Greater than 50%  of this time was spent counseling and coordinating care related to the above assessment and plan.  Mariana Kaufman, AGNP-C Palliative Medicine   Please contact Palliative Medicine Team phone at [phone] for questions and concerns.

## 2016-12-15 DIAGNOSIS — Z794 Long term (current) use of insulin: Secondary | ICD-10-CM

## 2016-12-15 DIAGNOSIS — E222 Syndrome of inappropriate secretion of antidiuretic hormone: Secondary | ICD-10-CM

## 2016-12-15 DIAGNOSIS — E1169 Type 2 diabetes mellitus with other specified complication: Secondary | ICD-10-CM

## 2016-12-15 LAB — GLUCOSE, CAPILLARY
GLUCOSE-CAPILLARY: 132 mg/dL — AB (ref 65–99)
Glucose-Capillary: 125 mg/dL — ABNORMAL HIGH (ref 65–99)
Glucose-Capillary: 216 mg/dL — ABNORMAL HIGH (ref 65–99)
Glucose-Capillary: 89 mg/dL (ref 65–99)

## 2016-12-15 LAB — CULTURE, BLOOD (ROUTINE X 2): Special Requests: ADEQUATE

## 2016-12-15 LAB — BASIC METABOLIC PANEL
Anion gap: 8 (ref 5–15)
BUN: 7 mg/dL (ref 6–20)
CHLORIDE: 100 mmol/L — AB (ref 101–111)
CO2: 21 mmol/L — AB (ref 22–32)
CREATININE: 0.82 mg/dL (ref 0.44–1.00)
Calcium: 8.2 mg/dL — ABNORMAL LOW (ref 8.9–10.3)
GFR calc Af Amer: >60 mL/min (ref >60)
GFR calc non Af Amer: >60 mL/min (ref >60)
GLUCOSE: 119 mg/dL — AB (ref 65–99)
Potassium: 3.5 mmol/L (ref 3.5–5.1)
Sodium: 129 mmol/L — ABNORMAL LOW (ref 135–145)

## 2016-12-15 LAB — SODIUM, URINE, RANDOM: SODIUM UR: 56 mmol/L

## 2016-12-15 LAB — OSMOLALITY, URINE: Osmolality, Ur: 335 mOsm/kg (ref 300–900)

## 2016-12-15 LAB — OSMOLALITY: OSMOLALITY: 271 mosm/kg — AB (ref 275–295)

## 2016-12-15 MED ORDER — SODIUM CHLORIDE 1 G PO TABS
2.0000 g | ORAL_TABLET | Freq: Once | ORAL | Status: AC
  Administered 2016-12-15: 2 g via ORAL
  Filled 2016-12-15: qty 2

## 2016-12-15 MED ORDER — ONDANSETRON 4 MG PO TBDP: 4.0000 mg | ORAL_TABLET | Freq: Three times a day (TID) | ORAL | Status: DC | PRN

## 2016-12-15 MED ORDER — ONDANSETRON 4 MG PO TBDP
4.0000 mg | ORAL_TABLET | ORAL | Status: AC
  Administered 2016-12-15: 4 mg via ORAL
  Filled 2016-12-15: qty 1

## 2016-12-15 MED ORDER — SODIUM CHLORIDE 1 G PO TABS: 2.0000 g | ORAL_TABLET | Freq: Once | ORAL | Status: DC

## 2016-12-15 NOTE — Progress Notes (Signed)
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 Pt advised of benefit of amputation in decreasing the risk of infection spreading. Pt has refused and is currently seeking palliative care. PMH significant for DKA, DM, depression and adjustment disorder    PT Comments    Pt is making slow progress towards her goals. Pt with flat affect and limited verbalization. Pt requested to go to bathroom and was min guard for bed mobility, transfers with RW and ambulation of 12 feet with RW. Pt requires skilled PT in the acute environment to progress gait training and to improve LE strength and endurance to safely mobilize in her discharge environment.   Follow Up Recommendations  Supervision - Intermittent;SNF     Equipment Recommendations  Rolling walker with 5" wheels;3in1 (PT)    Recommendations for Other Services OT consult     Precautions / Restrictions Restrictions Weight Bearing Restrictions: No    Mobility  Bed Mobility Overal bed mobility: Needs Assistance Bed Mobility: Supine to Sit     Supine to sit: HOB elevated;Min guard     General bed mobility comments: vc for hand placement on bedrails and scooting to EoB  Transfers Overall transfer level: Needs assistance Equipment used: Rolling walker (2 wheeled) Transfers: Sit to/from Stand Sit to Stand: Min assist         General transfer comment: minA for steadying. vc for hand placement   Ambulation/Gait Ambulation/Gait assistance: Min guard Ambulation Distance (Feet): 12 Feet Assistive device: Rolling walker (2 wheeled) Gait Pattern/deviations: Step-to pattern;Shuffle;Decreased stance time - right;Decreased weight shift to right;Antalgic Gait velocity: slowed Gait velocity interpretation: Below normal speed for age/gender General Gait  Details: antalgic gait with decreased weightbearing through R LE vc for walker sequencing and increased weightbearing through UE to advance L LE       Balance Overall balance assessment: Needs assistance Sitting-balance support: No upper extremity supported;Feet unsupported Sitting balance-Leahy Scale: Fair Sitting balance - Comments: able to balance EoB   Standing balance support: Bilateral upper extremity supported Standing balance-Leahy Scale: Fair Standing balance comment: requires RW support                            Cognition Arousal/Alertness: Lethargic Behavior During Therapy: Flat affect Overall Cognitive Status: No family/caregiver present to determine baseline cognitive functioning                                 General Comments: limited answers during interview             Pertinent Vitals/Pain Pain Assessment: Faces Faces Pain Scale: Hurts a little bit Pain Location: R foot Pain Descriptors / Indicators: Constant;Burning Pain Intervention(s): Monitored during session;Limited activity within patient's tolerance;Repositioned  VSS           PT Goals (current goals can now be found in the care plan section) Acute Rehab PT Goals Patient Stated Goal: go home PT Goal Formulation: With patient Time For Goal Achievement: 12/28/16 Potential to Achieve Goals: Poor Progress towards PT goals: Progressing toward goals    Frequency    Min 3X/week      PT Plan Current plan remains appropriate       AM-PAC PT "6 Clicks" Daily Activity  Outcome Measure  Difficulty turning over in bed (including adjusting bedclothes, sheets and blankets)?: A Lot Difficulty moving from lying on back to sitting on the side of the bed? : A Lot Difficulty sitting down on and standing up from a chair with arms (e.g., wheelchair, bedside commode, etc,.)?: A Lot Help needed moving to and from a bed to chair (including a wheelchair)?: A Little Help needed  walking in hospital room?: A Little Help needed climbing 3-5 steps with a railing? : Total 6 Click Score: 13    End of Session Equipment Utilized During Treatment: Gait belt Activity Tolerance: Patient limited by fatigue;Patient limited by lethargy Patient left: in chair;with call bell/phone within reach;with chair alarm set Nurse Communication: Mobility status PT Visit Diagnosis: Unsteadiness on feet (R26.81);Other abnormalities of gait and mobility (R26.89);Muscle weakness (generalized) (M62.81);Pain Pain - Right/Left: Right Pain - part of body: Ankle and joints of foot     Time: 5789-7847 PT Time Calculation (min) (ACUTE ONLY): 10 min  Charges:  $Gait Training: 8-22 mins                    G Codes:       Valdemar Mcclenahan B. Migdalia Dk PT, DPT Acute Rehabilitation  [phone] Pager [phone]     [place] 12/15/2016, 2:56 PM

## 2016-12-15 NOTE — Progress Notes (Addendum)
Daily Progress Note   Patient Name: Brenda Moore       Date: 12/15/2016 DOB: [birth year]  Age: 61 y.o. MRN#: [id number] Attending Physician: Albertine Patricia, MD Primary Care Physician: Patient, No Pcp Per Admit Date: 12/11/2016  Reason for Consultation/Follow-up: Establishing goals of care and Non pain symptom management  Subjective: Lying in bed. Nausea overnight and this morning since changing to oral antibiotics. Doesn't feel normal but starting to feel better. Ok with discharging to SNF with hospice. She is tired of defending her choice to her family but doesn't want me to discuss this with her family. Pain is being controlled with current po morphine. We discussed that pain will worsen over time and encouraged her to continue to discuss this with outpatient Hospice providers. She will eventually need long acting pain medication, but not yet.  Discussed advanced healthcare planning. She is interested in completing a healthcare power of attorney and living will. She is concerned about one of her family members trying to overrule it. Offered to complete today, but she requested I return tomorrow with paperwork to complete.   Review of Systems  Constitutional: Positive for malaise/fatigue and weight loss.  Neurological: Positive for weakness.  Psychiatric/Behavioral: Positive for depression. Negative for suicidal ideas. The patient is not nervous/anxious and does not have insomnia.   All other systems reviewed and are negative.   Length of Stay: 4  Current Medications: Scheduled Meds:  . ciprofloxacin  500 mg Oral BID  . doxycycline  100 mg Oral Q12H  . enoxaparin (LOVENOX) injection  40 mg Subcutaneous Q24H  . insulin aspart  0-5 Units Subcutaneous QHS  . insulin aspart  0-9 Units  Subcutaneous TID WC  . insulin glargine  8 Units Subcutaneous Daily  . methylphenidate  5 mg Oral q morning - 10a  . metroNIDAZOLE  500 mg Oral Q8H  . ondansetron  4 mg Oral NOW  . venlafaxine  37.5 mg Oral BID WC    Continuous Infusions:   PRN Meds: acetaminophen **OR** acetaminophen, morphine, morphine injection, ondansetron (ZOFRAN) IV, ondansetron  Physical Exam  Constitutional: She is oriented to person, place, and time.  Cardiovascular: Normal rate and regular rhythm.   Pulmonary/Chest: Effort normal and breath sounds normal.  Musculoskeletal: Normal range of motion.  Neurological: She is alert and oriented to person,  place, and time.  Skin:  Left foot with dressing in place, black toes with bloody drainage and gangrenous odor noted  Psychiatric: Judgment and thought content normal.  More engaged today            Vital Signs: BP (!) 144/81   Pulse 75   Temp 98.2 F (36.8 C)   Resp 17   Ht 5\' 10"  (1.778 m)   Wt 67.5 kg (148 lb 13 oz)   LMP  (LMP Unknown)   SpO2 98%   BMI 21.35 kg/m  SpO2: SpO2: 98 % O2 Device: O2 Device: Not Delivered O2 Flow Rate:    Intake/output summary:   Intake/Output Summary (Last 24 hours) at 12/15/16 1127 Last data filed at 12/14/16 2055  Gross per 24 hour  Intake              300 ml  Output               60 ml  Net              240 ml   LBM: Last BM Date: 12/11/16 Baseline Weight: Weight: 72.6 kg (160 lb) Most recent weight: Weight: 67.5 kg (148 lb 13 oz)       Palliative Assessment/Data: 50%    Flowsheet Rows     Most Recent Value  Intake Tab  Referral Department  Hospitalist  Unit at Time of Referral  Intermediate Care Unit  Palliative Care Primary Diagnosis  Sepsis/Infectious Disease  Date Notified  12/13/16  Palliative Care Type  New Palliative care  Reason for referral  Clarify Goals of Care  Date of Admission  12/11/16  # of days IP prior to Palliative referral  2  Clinical Assessment  Psychosocial & Spiritual  Assessment  Palliative Care Outcomes      Patient Active Problem List   Diagnosis Date Noted  . Advance care planning   . Palliative care by specialist   . Goals of care, counseling/discussion   . Protein-calorie malnutrition ([place])   . Chronic fatigue   . Bacteremia   . Adjustment disorder with depressed mood 12/12/2016  . Gangrene ([place]) 12/11/2016  . DKA (diabetic ketoacidoses) ([place]) 12/11/2016  . Osteomyelitis ([place]) 12/11/2016  . High anion gap metabolic acidosis 48/18/5631    Palliative Care Assessment & Plan   Patient Profile: 61 y.o. female  with no past medical history admitted on 12/11/2016 with   Bilateral wounds to feet. 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 She notes ongoing fatigue and feeling "not normal" for a long time. Surgery has recommended amputation of R foot, however, patient has very clearly declined surgery. Palliative medicine consulted for [place].   Assessment/Recommendations/Plan   Gangrene- continue current level of care w/ antibiotics  Nausea- likely r/t transitioning to oral antibiotics- recommend taking with food  Pain- morphine IR po 7.5mg  q4hr prn  Depression- continue venlafaxine 37.5mg  po bid and methylphenidate 5mg  qam  D/C to SNF with Hospice   PMT will continue to follow and support holistically  Advance directives paperwork to be completed tomorrow's visit per patient request  Goals of Care and Additional Recommendations:  Limitations on Scope of Treatment: No Surgical Procedures  Code Status:  DNR  Prognosis:   < 6 months  Discharge Planning:  To Be Determined  Care plan was discussed with patient.  Thank you for allowing the Palliative Medicine Team to assist in the care of  this patient.   Time In: 1030 Time Out: 1115 Total Time 45 mins Prolonged Time Billed No      Greater than 50%  of this time was spent counseling and coordinating care  related to the above assessment and plan.  Mariana Kaufman, AGNP-C Palliative Medicine   Please contact Palliative Medicine Team phone at [phone] for questions and concerns.

## 2016-12-15 NOTE — NC FL2 (Signed)
[place] LEVEL OF CARE SCREENING TOOL     IDENTIFICATION  Patient Name: Brenda Moore Birthdate: 11-Aug-1955 Sex: female Admission Date (Current Location): 12/11/2016  [facility] and [place] Number:  Herbalist and Address:  The . [facility], [place] [address], [place], [place] [address]      Provider Number: [id number]  Attending Physician Name and Address:  Elgergawy, Silver Huguenin, MD  Relative Name and Phone Number:  [name], [name] - [phone] (mobile) and [phone] (work)     Current Level of Care: Hospital Recommended Level of Care: [place] Prior Approval Number:    Date Approved/Denied:   PASRR Number:  (Submitted for [place] 12/15/16)  Discharge Plan: SNF    Current Diagnoses: Patient Active Problem List   Diagnosis Date Noted  . Advance care planning   . Palliative care by specialist   . Goals of care, counseling/discussion   . Protein-calorie malnutrition ([place])   . Chronic fatigue   . Bacteremia   . Adjustment disorder with depressed mood 12/12/2016  . Gangrene ([place]) 12/11/2016  . DKA (diabetic ketoacidoses) ([place]) 12/11/2016  . Osteomyelitis ([place]) 12/11/2016  . High anion gap metabolic acidosis 77/41/2878    Orientation RESPIRATION BLADDER Height & Weight     Self, Time, Situation, Place  Normal Continent Weight: 148 lb 13 oz (67.5 kg) Height:  5\' 10"  (177.8 cm)  BEHAVIORAL SYMPTOMS/MOOD NEUROLOGICAL BOWEL NUTRITION STATUS      Continent Diet (Carb modified)  AMBULATORY STATUS COMMUNICATION OF NEEDS Skin   Limited Assist (Min guard - 5 feet) Verbally Other (Comment) (Gangrene right foot wound)                       Personal Care Assistance Level of Assistance  Bathing, Feeding, Dressing Bathing Assistance: Limited assistance Feeding assistance: Independent Dressing Assistance: Limited assistance     Functional Limitations Info  Sight, Hearing, Speech Sight Info: Adequate Hearing Info:  Adequate Speech Info: Adequate    SPECIAL CARE FACTORS FREQUENCY  PT (By licensed PT)     PT Frequency: Evaluation 5/22 and a minimum of 3X per week therapy recommended              Contractures Contractures Info: Not present    Additional Factors Info  Code Status, Allergies, Insulin Sliding Scale Code Status Info: DNR Allergies Info: Poison Oak extract   Insulin Sliding Scale Info: 0-5 Units daily at bedtime and 0-9 Units 3 times per day with meals       Current Medications (12/15/2016):  This is the current hospital active medication list Current Facility-Administered Medications  Medication Dose Route Frequency Provider Last Rate Last Dose  . acetaminophen (TYLENOL) tablet 650 mg  650 mg Oral Q6H PRN Mariel Aloe, MD       Or  . acetaminophen (TYLENOL) suppository 650 mg  650 mg Rectal Q6H PRN Mariel Aloe, MD      . ciprofloxacin (CIPRO) tablet 500 mg  500 mg Oral BID Mariel Aloe, MD   500 mg at 12/15/16 1157  . doxycycline (VIBRA-TABS) tablet 100 mg  100 mg Oral Q12H Earlie Counts, NP   100 mg at 12/15/16 1157  . enoxaparin (LOVENOX) injection 40 mg  40 mg Subcutaneous Q24H Mariel Aloe, MD   40 mg at 12/15/16 1156  . insulin aspart (novoLOG) injection 0-5 Units  0-5 Units Subcutaneous QHS Mariel Aloe, MD   3 Units at 12/13/16  2239  . insulin aspart (novoLOG) injection 0-9 Units  0-9 Units Subcutaneous TID WC Mariel Aloe, MD   2 Units at 12/14/16 1756  . insulin glargine (LANTUS) injection 8 Units  8 Units Subcutaneous Daily Mariel Aloe, MD   8 Units at 12/15/16 1157  . methylphenidate (RITALIN) tablet 5 mg  5 mg Oral q morning - 10a Earlie Counts, NP   5 mg at 12/14/16 1304  . metroNIDAZOLE (FLAGYL) tablet 500 mg  500 mg Oral Q8H Mariel Aloe, MD   500 mg at 12/15/16 0526  . morphine (MSIR) tablet 7.5 mg  7.5 mg Oral Q4H PRN Earlie Counts, NP   7.5 mg at 12/14/16 0940  . morphine 4 MG/ML injection 1 mg  1 mg Intravenous Q4H PRN Rise Patience, MD   1 mg at 12/14/16 0054  . ondansetron (ZOFRAN) injection 4 mg  4 mg Intravenous Q6H PRN Schorr, Rhetta Mura, NP      . ondansetron (ZOFRAN-ODT) disintegrating tablet 4 mg  4 mg Oral Q8H PRN Earlie Counts, NP      . sodium chloride tablet 2 g  2 g Oral Once Elgergawy, Silver Huguenin, MD      . venlafaxine [facility]) tablet 37.5 mg  37.5 mg Oral BID WC Earlie Counts, NP   37.5 mg at 12/15/16 1157     Discharge Medications: Please see discharge summary for a list of discharge medications.  Relevant Imaging Results:  Relevant Lab Results:   Additional Information ss#[id number].  Sable Feil, LCSW

## 2016-12-15 NOTE — Progress Notes (Addendum)
PROGRESS NOTE    Brenda Moore  XFG:[id number] DOB: 17-Aug-1955 DOA: 12/11/2016 PCP: Patient, No Pcp Per   Brief Narrative: Brenda Moore is a 61 y.o. female with no past medical history presented with dry gangrene and osteomyelitis of her right foot. Patient is obviously very depressed but denies being depressed. No sepsis at this time. Started on empiric antibiotic therapy. Orthopedic surgery consulted and suggesting right limb amputation, however, patient is refusing. Psych consulted for evaluation. Infectious disease consulted since patient is refusing amputation.   Assessment & Plan:   Principal Problem:   Gangrene ([place]) Active Problems:   DKA (diabetic ketoacidoses) (HCC)   Osteomyelitis (HCC)   High anion gap metabolic acidosis   Adjustment disorder with depressed mood   Bacteremia   Advance care planning   Palliative care by specialist   Goals of care, counseling/discussion   Protein-calorie malnutrition ([place])   Chronic fatigue   Necrotic gangrene of right foot  - Discussed at length with patient today, she still refusing amputation, has seen by psychiatry, Dr. Teryl Lucy  discussed with psychiatry on 5/21 and patient has capacity. -Transition to doxycycline, cipro and flagyl x2-4 weeks per ID recommendations -nutrition recommendations -palliative care consult: Hospice -PT: SNF  Diabetes mellitus, new onset with diabetic acidosis on admission  - This is new diagnosis, anion gap closed, hemoglobin A1c is 14.2 . - CBG overall acceptable on current regimen, continue with Lantus and insulin sliding scale  Depressed mood - Patient denies symptoms of depressed mood or history of depression. On evaluation, patient appears to be having depressed mood. Psychiatry recommending treatment for depression, continue with Effexor  Anemia - Stable. Appears secondary to chronic disease. B12 significantly elevated. Initial hemoglobin likely secondary to hemoconcentration in setting of  dehydration/DKA  Hyponatremia - No improvement after appropriate hydration, urine osmolality 335, urine sodium 56, serum osmolality 271, this is consistent with mild SIADH, will start patient on fluid restriction. I will give one time 2 g salt tablets.    DVT prophylaxis: SCDs, Lovenox Code Status: DNR/DNI Family Communication: Sister at bedside Disposition Plan: Discharge to SNF with hospice   Consultants:   Orthopedic surgery, Dr. Marlou Sa  Psychiatry, Dr. Darleene Cleaver  Infectious disease  Procedures:   Insulin drip (5/19>>5/20)  Antimicrobials:  Zosyn (5/19)  Vancomycin (5/19>>5/22)  Cefepime (5/19>>5/22)  Metronidazole (5/19>>  Doxycycline (5/22>>  Ciprofloxacin (5/22>>   Subjective: Pain is controlled. No chills or fevers overnight.  Objective: Vitals:   12/14/16 0438 12/14/16 1449 12/14/16 2051 12/15/16 0649  BP: 118/61 134/78 (!) 151/79 (!) 144/81  Pulse: 88 77 78 75  Resp: 19 18 18 17   Temp: 98.3 F (36.8 C) 98.2 F (36.8 C) 98.1 F (36.7 C) 98.2 F (36.8 C)  TempSrc: Oral Oral Oral   SpO2: 98% 98% 98% 98%  Weight:      Height:        Intake/Output Summary (Last 24 hours) at 12/15/16 1209 Last data filed at 12/14/16 2055  Gross per 24 hour  Intake              300 ml  Output               60 ml  Net              240 ml   Filed Weights   12/11/16 1440 12/11/16 2030  Weight: 72.6 kg (160 lb) 67.5 kg (148 lb 13 oz)    Examination:  Awake alert oriented 3, in no apparent distress. Good  air entry bilaterally, clear to auscultation RRR, S1 and S2 heard , no rubs murmurs gallops, Abdomen soft, nontender nondistended Extremity, right foot is bandaged, those can be seen, which are necrotic, with foul-smelling odor, left toe with necrotic area in the left great toe.   Data Reviewed: I have personally reviewed following labs and imaging studies  CBC:  Recent Labs Lab 12/11/16 1429 12/12/16 0550 12/13/16 0318  WBC 15.7* 14.1* 12.7*    NEUTROABS 13.2*  --   --   HGB 11.0* 8.2* 8.3*  HCT 33.7* 26.0* 26.1*  MCV 82.2 81.3 81.3  PLT 406* 378 308   Basic Metabolic Panel:  Recent Labs Lab 12/12/16 0932 12/12/16 1451 12/13/16 0318 12/14/16 0607 12/15/16 0505  NA 131* 131* 131* 129* 129*  K 3.2* 3.4* 4.0 3.4* 3.5  CL 102 103 101 101 100*  CO2 21* 20* 22 20* 21*  GLUCOSE 187* 247* 176* 199* 119*  BUN 11 10 12 9 7   CREATININE 0.71 0.88 0.89 0.84 0.82  CALCIUM 8.1* 8.2* 8.1* 8.0* 8.2*   GFR: Estimated Creatinine Clearance: 77.7 mL/min (by C-G formula based on SCr of 0.82 mg/dL). Liver Function Tests:  Recent Labs Lab 12/11/16 1429  AST 29  ALT 39  ALKPHOS 149*  BILITOT 0.9  PROT 7.4  ALBUMIN 1.8*   No results for input(s): LIPASE, AMYLASE in the last 168 hours. No results for input(s): AMMONIA in the last 168 hours. Coagulation Profile: No results for input(s): INR, PROTIME in the last 168 hours. Cardiac Enzymes: No results for input(s): CKTOTAL, CKMB, CKMBINDEX, TROPONINI in the last 168 hours. BNP (last 3 results) No results for input(s): PROBNP in the last 8760 hours. HbA1C:  Recent Labs  12/12/16 1352  HGBA1C 14.2*   CBG:  Recent Labs Lab 12/14/16 0758 12/14/16 1158 12/14/16 1701 12/14/16 2137 12/15/16 0727  GLUCAP 170* 252* 195* 143* 125*   Lipid Profile: No results for input(s): CHOL, HDL, LDLCALC, TRIG, CHOLHDL, LDLDIRECT in the last 72 hours. Thyroid Function Tests: No results for input(s): TSH, T4TOTAL, FREET4, T3FREE, THYROIDAB in the last 72 hours. Anemia Panel:  Recent Labs  12/12/16 1352  VITAMINB12 5,149*  FOLATE 18.4  FERRITIN 941*  TIBC 171*  IRON 16*  RETICCTPCT 1.0   Sepsis Labs:  Recent Labs Lab 12/11/16 1349  LATICACIDVEN 1.50    Recent Results (from the past 240 hour(s))  Blood culture (routine x 2)     Status: Abnormal   Collection Time: 12/11/16  2:18 PM  Result Value Ref Range Status   Specimen Description BLOOD LEFT ANTECUBITAL  Final    Special Requests   Final    BOTTLES DRAWN AEROBIC AND ANAEROBIC Blood Culture adequate volume   Culture  Setup Time   Final    GRAM POSITIVE COCCI IN CLUSTERS IN BOTH AEROBIC AND ANAEROBIC BOTTLES CRITICAL RESULT CALLED TO, READ BACK BY AND VERIFIED WITH: C BALL,PHARMD AT 1011 12/12/16 BY L BENFIELD    Culture (A)  Final    STAPHYLOCOCCUS SPECIES (COAGULASE NEGATIVE) THE SIGNIFICANCE OF ISOLATING THIS ORGANISM FROM A SINGLE SET OF BLOOD CULTURES WHEN MULTIPLE SETS ARE DRAWN IS UNCERTAIN. PLEASE NOTIFY THE MICROBIOLOGY DEPARTMENT WITHIN ONE WEEK IF SPECIATION AND SENSITIVITIES ARE REQUIRED.    Report Status 12/14/2016 FINAL  Final   Organism ID, Bacteria STAPHYLOCOCCUS SPECIES (COAGULASE NEGATIVE)  Final      Susceptibility   Staphylococcus species (coagulase negative) - MIC*    CIPROFLOXACIN <=0.5 SENSITIVE Sensitive     ERYTHROMYCIN >=8  RESISTANT Resistant     GENTAMICIN <=0.5 SENSITIVE Sensitive     OXACILLIN <=0.25 SENSITIVE Sensitive     TETRACYCLINE <=1 SENSITIVE Sensitive     VANCOMYCIN 1 SENSITIVE Sensitive     TRIMETH/SULFA <=10 SENSITIVE Sensitive     CLINDAMYCIN <=0.25 SENSITIVE Sensitive     RIFAMPIN <=0.5 SENSITIVE Sensitive     Inducible Clindamycin NEGATIVE Sensitive     * STAPHYLOCOCCUS SPECIES (COAGULASE NEGATIVE)  Blood Culture ID Panel (Reflexed)     Status: Abnormal   Collection Time: 12/11/16  2:18 PM  Result Value Ref Range Status   Enterococcus species NOT DETECTED NOT DETECTED Final   Listeria monocytogenes NOT DETECTED NOT DETECTED Final   Staphylococcus species DETECTED (A) NOT DETECTED Final    Comment: Methicillin (oxacillin) resistant coagulase negative staphylococcus. Possible blood culture contaminant (unless isolated from more than one blood culture draw or clinical case suggests pathogenicity). No antibiotic treatment is indicated for blood  culture contaminants. CRITICAL RESULT CALLED TO, READ BACK BY AND VERIFIED WITH: C BALL,PHARMD AT 1011 12/12/16  BY L BENFIELD    Staphylococcus aureus NOT DETECTED NOT DETECTED Final   Methicillin resistance DETECTED (A) NOT DETECTED Final    Comment: CRITICAL RESULT CALLED TO, READ BACK BY AND VERIFIED WITH: C BALL, PHARMD AT 1011 12/12/16 BY L BENFIELD    Streptococcus species NOT DETECTED NOT DETECTED Final   Streptococcus agalactiae NOT DETECTED NOT DETECTED Final   Streptococcus pneumoniae NOT DETECTED NOT DETECTED Final   Streptococcus pyogenes NOT DETECTED NOT DETECTED Final   Acinetobacter baumannii NOT DETECTED NOT DETECTED Final   Enterobacteriaceae species NOT DETECTED NOT DETECTED Final   Enterobacter cloacae complex NOT DETECTED NOT DETECTED Final   Escherichia coli NOT DETECTED NOT DETECTED Final   Klebsiella oxytoca NOT DETECTED NOT DETECTED Final   Klebsiella pneumoniae NOT DETECTED NOT DETECTED Final   Proteus species NOT DETECTED NOT DETECTED Final   Serratia marcescens NOT DETECTED NOT DETECTED Final   Haemophilus influenzae NOT DETECTED NOT DETECTED Final   Neisseria meningitidis NOT DETECTED NOT DETECTED Final   Pseudomonas aeruginosa NOT DETECTED NOT DETECTED Final   Candida albicans NOT DETECTED NOT DETECTED Final   Candida glabrata NOT DETECTED NOT DETECTED Final   Candida krusei NOT DETECTED NOT DETECTED Final   Candida parapsilosis NOT DETECTED NOT DETECTED Final   Candida tropicalis NOT DETECTED NOT DETECTED Final  Blood culture (routine x 2)     Status: None (Preliminary result)   Collection Time: 12/11/16  2:35 PM  Result Value Ref Range Status   Specimen Description BLOOD RIGHT FOREARM  Final   Special Requests   Final    BOTTLES DRAWN AEROBIC ONLY Blood Culture adequate volume   Culture NO GROWTH 3 DAYS  Final   Report Status PENDING  Incomplete  MRSA PCR Screening     Status: None   Collection Time: 12/11/16  9:16 PM  Result Value Ref Range Status   MRSA by PCR NEGATIVE NEGATIVE Final    Comment:        The GeneXpert MRSA Assay (FDA approved for NASAL  specimens only), is one component of a comprehensive MRSA colonization surveillance program. It is not intended to diagnose MRSA infection nor to guide or monitor treatment for MRSA infections.   Culture, Urine     Status: None   Collection Time: 12/12/16 10:28 AM  Result Value Ref Range Status   Specimen Description URINE, RANDOM  Final   Special Requests NONE  Final  Culture NO GROWTH  Final   Report Status 12/14/2016 FINAL  Final         Radiology Studies: No results found.      Scheduled Meds: . ciprofloxacin  500 mg Oral BID  . doxycycline  100 mg Oral Q12H  . enoxaparin (LOVENOX) injection  40 mg Subcutaneous Q24H  . insulin aspart  0-5 Units Subcutaneous QHS  . insulin aspart  0-9 Units Subcutaneous TID WC  . insulin glargine  8 Units Subcutaneous Daily  . methylphenidate  5 mg Oral q morning - 10a  . metroNIDAZOLE  500 mg Oral Q8H  . venlafaxine  37.5 mg Oral BID WC   Continuous Infusions:    LOS: 4 days     Sheila Ocasio, MD [facility] 12/15/2016, 12:09 PM Pager: [phone]  If 7PM-7AM, please contact night-coverage [email or web address] Password [facility] 12/15/2016, 12:09 PM

## 2016-12-15 NOTE — Progress Notes (Signed)
Event noted Pt does not want amputation

## 2016-12-15 NOTE — Progress Notes (Signed)
   12/15/16 1100  Clinical Encounter Type  Visited With Patient;Health care provider  Visit Type Follow-up;Spiritual support  Referral From Family;Nurse  Consult/Referral To Chaplain  Spiritual Encounters  Spiritual Needs Emotional  Stress Factors  Patient Stress Factors None identified    Followed up on [facility] consult. Patient refused visit. Spoke with son and nurse. 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 Imanuel Pruiett L. Volanda Napoleon, MDiv

## 2016-12-16 LAB — BASIC METABOLIC PANEL
ANION GAP: 8 (ref 5–15)
BUN: 6 mg/dL (ref 6–20)
CALCIUM: 8.3 mg/dL — AB (ref 8.9–10.3)
CO2: 23 mmol/L (ref 22–32)
CREATININE: 0.88 mg/dL (ref 0.44–1.00)
Chloride: 102 mmol/L (ref 101–111)
GLUCOSE: 96 mg/dL (ref 65–99)
Potassium: 3.5 mmol/L (ref 3.5–5.1)
Sodium: 133 mmol/L — ABNORMAL LOW (ref 135–145)

## 2016-12-16 LAB — CBC
HCT: 28.7 % — ABNORMAL LOW (ref 36.0–46.0)
Hemoglobin: 9.1 g/dL — ABNORMAL LOW (ref 12.0–15.0)
MCH: 26.1 pg (ref 26.0–34.0)
MCHC: 31.7 g/dL (ref 30.0–36.0)
MCV: 82.5 fL (ref 78.0–100.0)
PLATELETS: 489 10*3/uL — AB (ref 150–400)
RBC: 3.48 MIL/uL — ABNORMAL LOW (ref 3.87–5.11)
RDW: 14.6 % (ref 11.5–15.5)
WBC: 8.5 10*3/uL (ref 4.0–10.5)

## 2016-12-16 LAB — GLUCOSE, CAPILLARY
GLUCOSE-CAPILLARY: 88 mg/dL (ref 65–99)
GLUCOSE-CAPILLARY: 131 mg/dL — AB (ref 65–99)
Glucose-Capillary: 91 mg/dL (ref 65–99)
Glucose-Capillary: 163 mg/dL — ABNORMAL HIGH (ref 65–99)

## 2016-12-16 LAB — CULTURE, BLOOD (ROUTINE X 2)
Culture: NO GROWTH
Special Requests: ADEQUATE

## 2016-12-16 MED ORDER — SACCHAROMYCES BOULARDII 250 MG PO CAPS: 250.0000 mg | ORAL_CAPSULE | Freq: Two times a day (BID) | ORAL | Status: AC

## 2016-12-16 MED ORDER — DOXYCYCLINE HYCLATE 100 MG PO TABS: 100.0000 mg | ORAL_TABLET | Freq: Two times a day (BID) | ORAL | Status: DC

## 2016-12-16 MED ORDER — METHYLPHENIDATE HCL 5 MG PO TABS: 5.0000 mg | ORAL_TABLET | Freq: Every morning | ORAL | 0 refills | Status: DC

## 2016-12-16 MED ORDER — CIPROFLOXACIN HCL 500 MG PO TABS: 500.0000 mg | ORAL_TABLET | Freq: Two times a day (BID) | ORAL | Status: DC

## 2016-12-16 MED ORDER — METRONIDAZOLE 500 MG PO TABS: 500.0000 mg | ORAL_TABLET | Freq: Three times a day (TID) | ORAL | Status: DC

## 2016-12-16 MED ORDER — MORPHINE SULFATE 15 MG PO TABS: 7.5000 mg | ORAL_TABLET | ORAL | 0 refills | Status: DC | PRN

## 2016-12-16 MED ORDER — INSULIN ASPART 100 UNIT/ML ~~LOC~~ SOLN: 0.0000 [IU] | Freq: Three times a day (TID) | SUBCUTANEOUS | 11 refills | Status: DC

## 2016-12-16 MED ORDER — ACETAMINOPHEN 325 MG PO TABS: 650.0000 mg | ORAL_TABLET | Freq: Four times a day (QID) | ORAL | Status: AC | PRN

## 2016-12-16 MED ORDER — INSULIN GLARGINE 100 UNIT/ML ~~LOC~~ SOLN
8.0000 [IU] | Freq: Every day | SUBCUTANEOUS | 11 refills | Status: DC
Start: 2016-12-17 — End: 2018-02-09

## 2016-12-16 MED ORDER — INSULIN ASPART 100 UNIT/ML ~~LOC~~ SOLN: 0.0000 [IU] | Freq: Every day | SUBCUTANEOUS | 11 refills | Status: DC

## 2016-12-16 MED ORDER — VENLAFAXINE HCL 37.5 MG PO TABS: 37.5000 mg | ORAL_TABLET | Freq: Two times a day (BID) | ORAL | Status: DC

## 2016-12-16 NOTE — Discharge Instructions (Signed)
Follow withPCP at facility - Patient will need referral to hospice service upon admission to facility  Get CBC, CMP,  checked  by Primary MD next visit.    Activity: As tolerated with Full fall precautions use walker/cane & assistance as needed   Disposition SNF   Diet: Heart Healthy , carbohydrate modified, with feeding assistance and aspiration precautions.   On your next visit with your primary care physician please Get Medicines reviewed and adjusted.   Please request your Prim.MD to go over all Hospital Tests and Procedure/Radiological results at the follow up, please get all Hospital records sent to your Prim MD by signing hospital release before you go home.   If you experience worsening of your admission symptoms, develop shortness of breath, life threatening emergency, suicidal or homicidal thoughts you must seek medical attention immediately by calling 911 or calling your MD immediately  if symptoms less severe.  You Must read complete instructions/literature along with all the possible adverse reactions/side effects for all the Medicines you take and that have been prescribed to you. Take any new Medicines after you have completely understood and accpet all the possible adverse reactions/side effects.   Do not drive, operating heavy machinery, perform activities at heights, swimming or participation in water activities or provide baby sitting services if your were admitted for syncope or siezures until you have seen by Primary MD or a Neurologist and advised to do so again.  Do not drive when taking Pain medications.    Do not take more than prescribed Pain, Sleep and Anxiety Medications  Special Instructions: If you have smoked or chewed Tobacco  in the last 2 yrs please stop smoking, stop any regular Alcohol  and or any Recreational drug use.  Wear Seat belts while driving.   Please note  You were cared for by a hospitalist during your hospital stay. If you have  any questions about your discharge medications or the care you received while you were in the hospital after you are discharged, you can call the unit and asked to speak with the hospitalist on call if the hospitalist that took care of you is not available. Once you are discharged, your primary care physician will handle any further medical issues. Please note that NO REFILLS for any discharge medications will be authorized once you are discharged, as it is imperative that you return to your primary care physician (or establish a relationship with a primary care physician if you do not have one) for your aftercare needs so that they can reassess your need for medications and monitor your lab values.

## 2016-12-16 NOTE — Clinical Social Work Note (Signed)
CSW met with pt and son-Anson Malone at bedside to address consult for SNF placement. Pt agreed to SNF. [facility] faxed and pt chose [facility] (1st choice). Kirstin at facility requested insurance auth. CSW assessment to follow. CSW following for d/c needs.   Oretha Ellis, [place], [place] Work [phone]

## 2016-12-16 NOTE — Discharge Summary (Signed)
Brenda Moore, is a 61 y.o. female  DOB 1955/09/17  MRN [id number].  Admission date:  12/11/2016  Admitting Physician  Mariel Aloe, MD  Discharge Date:  12/16/2016   Primary MD  Patient, No Pcp Per  Recommendations for primary care physician for things to follow:  - Patient will need referral to hospice service upon her arrival to your facility - Please check CBC, BMP in 3 days.  CODE STATUS DO NOT RESUSCITATE   Admission Diagnosis  Dehydration [E86.0] Hyponatremia [E87.1] Hyperglycemia [R73.9] Osteomyelitis of right foot, unspecified type ([place]) [M86.9] Protein-calorie malnutrition, unspecified severity ([place]) [E46] Gangrene ([place]) [I96]   Discharge Diagnosis  Dehydration [E86.0] Hyponatremia [E87.1] Hyperglycemia [R73.9] Osteomyelitis of right foot, unspecified type ([place]) [M86.9] Protein-calorie malnutrition, unspecified severity ([place]) [E46] Gangrene ([place]) [I96]    Principal Problem:   Gangrene ([place]) Active Problems:   DKA (diabetic ketoacidoses) ([place])   Osteomyelitis ([place])   High anion gap metabolic acidosis   Adjustment disorder with depressed mood   Bacteremia   Advance care planning   Palliative care by specialist   Goals of care, counseling/discussion   Protein-calorie malnutrition ([place])   Chronic fatigue      History reviewed. No pertinent past medical history.  History reviewed. No pertinent surgical history.     History of present illness and  Hospital Course:     Kindly see H&P for history of present illness and admission details, please review complete Labs, Consult reports and Test reports for all details in brief  HPI  from the history and physical done on the day of admission 12/11/2016 HPI: Brenda Moore is a 61 y.o. female with no significant medical history. About 2 months ago, patient noticed that she was weeping blood from her right foot. She was concerned that  there is possibly some kind of "impurity" and decided to try Epsom salt to "draw out the impurities." Over time, she started noticing some purulent drainage emanating from her wound. She reports associated pain and was using Tylenol to help. She did not seek medical attention because she thought it would go away on its own.   ED Course: Vitals: Afebrile. Normal pulse. Normotensive. 97% on room air. Labs: Sodium of 128, glucose of 305, CO2 of 20, creatinine of 1.07, alkaline phosphatase of 149, albumin of 1.8, WBC of 15.7 K, hemoglobin of 11. Imaging: Foot x-ray significant for evidence of gas gangrene and osteomyelitis Medications/Course: Vancomycin, Zosyn, Tdap   Hospital Course  Brenda Moore is a 61 y.o. female with no past medical history presented with dry gangrene and osteomyelitis of her right foot. Patient is obviously very depressed but denies being depressed. No sepsis at this time. Started on empiric antibiotic therapy. Orthopedic surgery consulted and suggesting right limb amputation, however, patient is refusing. Psych consulted for evaluation. Infectious disease consulted since patient is refusing amputation.  Necrotic gangrene of right foot  - Discussed at length with patient multiple times over last 48 hours, she still refusing amputation, informed her about risks of progression of  infection, possible bacteremia and death, she still adamant about refusing surgery or amputation, was seen by psychiatry, Dr. Teryl Lucy  discussed with psychiatry on 5/21 and patient has capacity. -Transition to doxycycline, cipro and flagyl x2-4 weeks per ID recommendations, to finish total of 4 weeks -palliative care consult: Discharge to SNF, with hospice referral upon arrival  -PT: SNF  Diabetes mellitus, new onset with diabetic acidosis on admission  - This is new diagnosis, anion gap closed, hemoglobin A1c is 14.2 . - CBG overall acceptable on current regimen, continue with Lantus 8 units, and insulin  sliding scale  Depressed mood - Patient denies symptoms of depressed mood or history of depression. On evaluation, patient appears to be having depressed mood. Continue with Effexor and methylphenidate.  Anemia - Stable. Appears secondary to chronic disease. B12 significantly elevated. Initial hemoglobin likely secondary to hemoconcentration in setting of dehydration/DKA  Hyponatremia -  urine osmolality 335, urine sodium 56, serum osmolality 271, this is consistent with mild SIADH, significant improvement with fluid restriction, and on salt tablets, sodium is 133 on discharge, please recheck BMP in 3 days, if recurrent hyponatremia, reinitiate fluid restriction.     Discharge Condition:    < 6 months as long she is refusing surgery  Hospice to be consulted upon patient arrival to your facility   Follow UP  SNF physician Hospice   Discharge Instructions  and  Discharge Medications   Discharge Instructions    Discharge instructions    Complete by:  As directed    Follow withPCP at facility - Patient will need referral to hospice service upon admission to facility  Get CBC, CMP,  checked  by Primary MD next visit.    Activity: As tolerated with Full fall precautions use walker/cane & assistance as needed   Disposition SNF   Diet: Heart Healthy , carbohydrate modified, with feeding assistance and aspiration precautions.   On your next visit with your primary care physician please Get Medicines reviewed and adjusted.   Please request your Prim.MD to go over all Hospital Tests and Procedure/Radiological results at the follow up, please get all Hospital records sent to your Prim MD by signing hospital release before you go home.   If you experience worsening of your admission symptoms, develop shortness of breath, life threatening emergency, suicidal or homicidal thoughts you must seek medical attention immediately by calling 911 or calling your MD immediately  if  symptoms less severe.  You Must read complete instructions/literature along with all the possible adverse reactions/side effects for all the Medicines you take and that have been prescribed to you. Take any new Medicines after you have completely understood and accpet all the possible adverse reactions/side effects.   Do not drive, operating heavy machinery, perform activities at heights, swimming or participation in water activities or provide baby sitting services if your were admitted for syncope or siezures until you have seen by Primary MD or a Neurologist and advised to do so again.  Do not drive when taking Pain medications.    Do not take more than prescribed Pain, Sleep and Anxiety Medications  Special Instructions: If you have smoked or chewed Tobacco  in the last 2 yrs please stop smoking, stop any regular Alcohol  and or any Recreational drug use.  Wear Seat belts while driving.   Please note  You were cared for by a hospitalist during your hospital stay. If you have any questions about your discharge medications or the care you received while you  were in the hospital after you are discharged, you can call the unit and asked to speak with the hospitalist on call if the hospitalist that took care of you is not available. Once you are discharged, your primary care physician will handle any further medical issues. Please note that NO REFILLS for any discharge medications will be authorized once you are discharged, as it is imperative that you return to your primary care physician (or establish a relationship with a primary care physician if you do not have one) for your aftercare needs so that they can reassess your need for medications and monitor your lab values.   Discharge wound care:    Complete by:  As directed    Paint right foot with betadine and change dressing daily   Increase activity slowly    Complete by:  As directed      Allergies as of 12/16/2016      Reactions    Poison Oak Extract Rash      Medication List    TAKE these medications   acetaminophen 325 MG tablet Commonly known as:  TYLENOL Take 2 tablets (650 mg total) by mouth every 6 (six) hours as needed for mild pain (or Fever >/= 101).   ciprofloxacin 500 MG tablet Commonly known as:  CIPRO Take 1 tablet (500 mg total) by mouth 2 (two) times daily. Continue for 4 weeks   doxycycline 100 MG tablet Commonly known as:  VIBRA-TABS Take 1 tablet (100 mg total) by mouth every 12 (twelve) hours. Continue for 4 weeks   insulin aspart 100 UNIT/ML injection Commonly known as:  novoLOG Inject 0-5 Units into the skin at bedtime.   insulin aspart 100 UNIT/ML injection Commonly known as:  novoLOG Inject 0-9 Units into the skin 3 (three) times daily with meals.   insulin glargine 100 UNIT/ML injection Commonly known as:  LANTUS Inject 0.08 mLs (8 Units total) into the skin daily. Start taking on:  12/17/2016   metroNIDAZOLE 500 MG tablet Commonly known as:  FLAGYL Take 1 tablet (500 mg total) by mouth every 8 (eight) hours. Please take for 4 weeks   morphine 15 MG tablet Commonly known as:  MSIR Take 0.5 tablets (7.5 mg total) by mouth every 4 (four) hours as needed for severe pain.   multivitamin with minerals tablet Take 1 tablet by mouth daily.   saccharomyces boulardii 250 MG capsule Commonly known as:  FLORASTOR Take 1 capsule (250 mg total) by mouth 2 (two) times daily.   venlafaxine 37.5 MG tablet Commonly known as:  EFFEXOR Take 1 tablet (37.5 mg total) by mouth 2 (two) times daily with a meal.         Diet and Activity recommendation: See Discharge Instructions above   Consults obtained -   Orthopedic surgery, Dr. Marlou Sa  Psychiatry, Dr. Darleene Cleaver  Infectious disease   Major procedures and Radiology Reports - PLEASE review detailed and final reports for all details, in brief -      Dg Foot Complete Right  Result Date: 12/11/2016 CLINICAL DATA:  Necrosis of  bilateral feet. EXAM: RIGHT FOOT COMPLETE - 3+ VIEW COMPARISON:  None. FINDINGS: Significant soft tissue gas identified overlying the calcaneus, the lateral midfoot, and numerous toes. There is significant bony destruction associated with the first proximal and distal phalanges. Involvement of the adjacent first metatarsal is also identified. The second through fourth toes and second through fourth metatarsals demonstrate no fracture or bony erosion. The tarsal bones are normal. Lucency  along the posterosuperior calcaneus is suspicious for osteomyelitis based on the lateral view. An MRI could better assess the extent of osteomyelitis. IMPRESSION: 1. Diffuse soft tissue gas consistent with a gas-forming organism. 2. Significant destruction associated with the great toe and adjacent first metatarsal consistent with osteomyelitis. 3. Suspected osteomyelitis along the posterosuperior aspect of the calcaneus. An MRI could better assess the extent of osteomyelitis. Electronically Signed   By: Dorise Bullion III M.D   On: 12/11/2016 14:29    Micro Results     Recent Results (from the past 240 hour(s))  Blood culture (routine x 2)     Status: Abnormal   Collection Time: 12/11/16  2:18 PM  Result Value Ref Range Status   Specimen Description BLOOD LEFT ANTECUBITAL  Final   Special Requests   Final    BOTTLES DRAWN AEROBIC AND ANAEROBIC Blood Culture adequate volume   Culture  Setup Time   Final    GRAM POSITIVE COCCI IN CLUSTERS IN BOTH AEROBIC AND ANAEROBIC BOTTLES CRITICAL RESULT CALLED TO, READ BACK BY AND VERIFIED WITH: C BALL,PHARMD AT 1011 12/12/16 BY L BENFIELD    Culture (A)  Final    STAPHYLOCOCCUS SPECIES (COAGULASE NEGATIVE) THE SIGNIFICANCE OF ISOLATING THIS ORGANISM FROM A SINGLE SET OF BLOOD CULTURES WHEN MULTIPLE SETS ARE DRAWN IS UNCERTAIN. PLEASE NOTIFY THE MICROBIOLOGY DEPARTMENT WITHIN ONE WEEK IF SPECIATION AND SENSITIVITIES ARE REQUIRED.    Report Status 12/14/2016 FINAL  Final    Organism ID, Bacteria STAPHYLOCOCCUS SPECIES (COAGULASE NEGATIVE)  Final      Susceptibility   Staphylococcus species (coagulase negative) - MIC*    CIPROFLOXACIN <=0.5 SENSITIVE Sensitive     ERYTHROMYCIN >=8 RESISTANT Resistant     GENTAMICIN <=0.5 SENSITIVE Sensitive     OXACILLIN <=0.25 SENSITIVE Sensitive     TETRACYCLINE <=1 SENSITIVE Sensitive     VANCOMYCIN 1 SENSITIVE Sensitive     TRIMETH/SULFA <=10 SENSITIVE Sensitive     CLINDAMYCIN <=0.25 SENSITIVE Sensitive     RIFAMPIN <=0.5 SENSITIVE Sensitive     Inducible Clindamycin NEGATIVE Sensitive     * STAPHYLOCOCCUS SPECIES (COAGULASE NEGATIVE)  Blood Culture ID Panel (Reflexed)     Status: Abnormal   Collection Time: 12/11/16  2:18 PM  Result Value Ref Range Status   Enterococcus species NOT DETECTED NOT DETECTED Final   Listeria monocytogenes NOT DETECTED NOT DETECTED Final   Staphylococcus species DETECTED (A) NOT DETECTED Final    Comment: Methicillin (oxacillin) resistant coagulase negative staphylococcus. Possible blood culture contaminant (unless isolated from more than one blood culture draw or clinical case suggests pathogenicity). No antibiotic treatment is indicated for blood  culture contaminants. CRITICAL RESULT CALLED TO, READ BACK BY AND VERIFIED WITH: C BALL,PHARMD AT 1011 12/12/16 BY L BENFIELD    Staphylococcus aureus NOT DETECTED NOT DETECTED Final   Methicillin resistance DETECTED (A) NOT DETECTED Final    Comment: CRITICAL RESULT CALLED TO, READ BACK BY AND VERIFIED WITH: C BALL, PHARMD AT 1011 12/12/16 BY L BENFIELD    Streptococcus species NOT DETECTED NOT DETECTED Final   Streptococcus agalactiae NOT DETECTED NOT DETECTED Final   Streptococcus pneumoniae NOT DETECTED NOT DETECTED Final   Streptococcus pyogenes NOT DETECTED NOT DETECTED Final   Acinetobacter baumannii NOT DETECTED NOT DETECTED Final   Enterobacteriaceae species NOT DETECTED NOT DETECTED Final   Enterobacter cloacae complex NOT DETECTED  NOT DETECTED Final   Escherichia coli NOT DETECTED NOT DETECTED Final   Klebsiella oxytoca NOT DETECTED NOT DETECTED Final  Klebsiella pneumoniae NOT DETECTED NOT DETECTED Final   Proteus species NOT DETECTED NOT DETECTED Final   Serratia marcescens NOT DETECTED NOT DETECTED Final   Haemophilus influenzae NOT DETECTED NOT DETECTED Final   Neisseria meningitidis NOT DETECTED NOT DETECTED Final   Pseudomonas aeruginosa NOT DETECTED NOT DETECTED Final   Candida albicans NOT DETECTED NOT DETECTED Final   Candida glabrata NOT DETECTED NOT DETECTED Final   Candida krusei NOT DETECTED NOT DETECTED Final   Candida parapsilosis NOT DETECTED NOT DETECTED Final   Candida tropicalis NOT DETECTED NOT DETECTED Final  Blood culture (routine x 2)     Status: None (Preliminary result)   Collection Time: 12/11/16  2:35 PM  Result Value Ref Range Status   Specimen Description BLOOD RIGHT FOREARM  Final   Special Requests   Final    BOTTLES DRAWN AEROBIC ONLY Blood Culture adequate volume   Culture NO GROWTH 4 DAYS  Final   Report Status PENDING  Incomplete  MRSA PCR Screening     Status: None   Collection Time: 12/11/16  9:16 PM  Result Value Ref Range Status   MRSA by PCR NEGATIVE NEGATIVE Final    Comment:        The GeneXpert MRSA Assay (FDA approved for NASAL specimens only), is one component of a comprehensive MRSA colonization surveillance program. It is not intended to diagnose MRSA infection nor to guide or monitor treatment for MRSA infections.   Culture, Urine     Status: None   Collection Time: 12/12/16 10:28 AM  Result Value Ref Range Status   Specimen Description URINE, RANDOM  Final   Special Requests NONE  Final   Culture NO GROWTH  Final   Report Status 12/14/2016 FINAL  Final       Today   Subjective:   Brenda Moore today has no headache,no chest or abdominal pain,Report generalized weakness and fatigue .  Objective:   Blood pressure (!) 142/74, pulse 72,  temperature 98.4 F (36.9 C), temperature source Oral, resp. rate 16, height 5\' 10"  (1.778 m), weight 67.5 kg (148 lb 13 oz), SpO2 96 %.   Intake/Output Summary (Last 24 hours) at 12/16/16 1037 Last data filed at 12/15/16 2220  Gross per 24 hour  Intake              120 ml  Output              150 ml  Net              -30 ml    Exam Awake Alert, Oriented x 3,Judgment and thoughts content normal Symmetrical Chest wall movement, Good air movement bilaterally, CTAB RRR,No Gallops,Rubs or new Murmurs, No Parasternal Heave +ve B.Sounds, Abd Soft, Non tender, No rebound -guarding or rigidity. right foot with dressing in place, with seranguinous drainage and gangrenous odor noted, left toe with necrotic area in the left great toe  Data Review   CBC w Diff: Lab Results  Component Value Date   WBC 8.5 12/16/2016   HGB 9.1 (L) 12/16/2016   HCT 28.7 (L) 12/16/2016   PLT 489 (H) 12/16/2016   LYMPHOPCT 11 12/11/2016   MONOPCT 4 12/11/2016   EOSPCT 0 12/11/2016   BASOPCT 0 12/11/2016    CMP: Lab Results  Component Value Date   NA 133 (L) 12/16/2016   K 3.5 12/16/2016   CL 102 12/16/2016   CO2 23 12/16/2016   BUN 6 12/16/2016   CREATININE 0.88 12/16/2016  PROT 7.4 12/11/2016   ALBUMIN 1.8 (L) 12/11/2016   BILITOT 0.9 12/11/2016   ALKPHOS 149 (H) 12/11/2016   AST 29 12/11/2016   ALT 39 12/11/2016  .   Total Time in preparing paper work, data evaluation and todays exam - 35 minutes  Krish Bailly M.D on 12/16/2016 at 10:37 AM  [facility]   Office  [phone]

## 2016-12-16 NOTE — Progress Notes (Signed)
Responded to [facility] Consult to complete AD. Spoke with patient and Patient nurse together patient want to take document home.  Patient was given instruction on how to proceed. Patient being discharged today.

## 2016-12-16 NOTE — Progress Notes (Signed)
Daily Progress Note   Patient Name: Brenda Moore       Date: 12/16/2016 DOB: [birth year]  Age: 61 y.o. MRN#: [id number] Attending Physician: Albertine Patricia, MD Primary Care Physician: Patient, No Pcp Per Admit Date: 12/11/2016  Reason for Consultation/Follow-up: Establishing goals of care and Non pain symptom management  Subjective: Lying in bed. Says she's been put through the ringer. Feels better. No complaints of pain or nausea. Feels unsure about discharge. Says she was glad to be in the hospital because she didn't have to worry about her next meal. We discussed that at nursing facility her meals will be provided for her as well as assistance with personal care and medications. She agreed to this and was appreciative. Son at bedside. Again discussed her decision of palliative vs surgery care for gangrenous foot. Reviewed advance directives with her. She chooses her son, Brenda Moore as Health care agent. She does not want life prolonging measures and once she leaves the hospital she does not want to return to the hospital. She desires Hospice care and comfort and support as she dies.   Review of Systems  Constitutional: Positive for malaise/fatigue and weight loss.  Neurological: Positive for weakness.  Psychiatric/Behavioral: Positive for depression. Negative for suicidal ideas. The patient is not nervous/anxious and does not have insomnia.   All other systems reviewed and are negative.   Length of Stay: 5  Current Medications: Scheduled Meds:  . ciprofloxacin  500 mg Oral BID  . doxycycline  100 mg Oral Q12H  . enoxaparin (LOVENOX) injection  40 mg Subcutaneous Q24H  . insulin aspart  0-5 Units Subcutaneous QHS  . insulin aspart  0-9 Units Subcutaneous TID WC  . insulin glargine  8 Units  Subcutaneous Daily  . methylphenidate  5 mg Oral q morning - 10a  . metroNIDAZOLE  500 mg Oral Q8H  . venlafaxine  37.5 mg Oral BID WC    Continuous Infusions:   PRN Meds: acetaminophen **OR** acetaminophen, morphine, morphine injection, ondansetron (ZOFRAN) IV, ondansetron  Physical Exam  Constitutional: She is oriented to person, place, and time.  Cardiovascular: Normal rate and regular rhythm.   Pulmonary/Chest: Effort normal and breath sounds normal.  Musculoskeletal: Normal range of motion.  Neurological: She is alert and oriented to person, place, and time.  Skin:  Left foot  with dressing in place, black toes with bloody drainage and gangrenous odor noted  Psychiatric: Judgment and thought content normal.  More engaged today            Vital Signs: BP (!) 142/74 (BP Location: Right Arm)   Pulse 72   Temp 98.4 F (36.9 C) (Oral)   Resp 16   Ht 5\' 10"  (1.778 m)   Wt 67.5 kg (148 lb 13 oz)   LMP  (LMP Unknown)   SpO2 96%   BMI 21.35 kg/m  SpO2: SpO2: 96 % O2 Device: O2 Device: Not Delivered O2 Flow Rate:    Intake/output summary:   Intake/Output Summary (Last 24 hours) at 12/16/16 1036 Last data filed at 12/15/16 2220  Gross per 24 hour  Intake              120 ml  Output              150 ml  Net              -30 ml   LBM: Last BM Date: 12/14/16 Baseline Weight: Weight: 72.6 kg (160 lb) Most recent weight: Weight: 67.5 kg (148 lb 13 oz)       Palliative Assessment/Data: 50%    Flowsheet Rows     Most Recent Value  Intake Tab  Referral Department  Hospitalist  Unit at Time of Referral  Intermediate Care Unit  Palliative Care Primary Diagnosis  Sepsis/Infectious Disease  Date Notified  12/13/16  Palliative Care Type  New Palliative care  Reason for referral  Clarify Goals of Care  Date of Admission  12/11/16  # of days IP prior to Palliative referral  2  Clinical Assessment  Psychosocial & Spiritual Assessment  Palliative Care Outcomes       Patient Active Problem List   Diagnosis Date Noted  . Advance care planning   . Palliative care by specialist   . Goals of care, counseling/discussion   . Protein-calorie malnutrition ([place])   . Chronic fatigue   . Bacteremia   . Adjustment disorder with depressed mood 12/12/2016  . Gangrene ([place]) 12/11/2016  . DKA (diabetic ketoacidoses) ([place]) 12/11/2016  . Osteomyelitis ([place]) 12/11/2016  . High anion gap metabolic acidosis 63/07/6008    Palliative Care Assessment & Plan   Patient Profile: 61 y.o. female  with no past medical history admitted on 12/11/2016 with   Bilateral wounds to feet. 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 She notes ongoing fatigue and feeling "not normal" for a long time. Surgery has recommended amputation of R foot, however, patient has very clearly declined surgery. Palliative medicine consulted for [place].   Assessment/Recommendations/Plan   Gangrene- continue current level of care w/ antibiotics, pain medication, and wound care  Pain- morphine IR po 7.5mg  q4hr prn  Depression- continue venlafaxine 37.5mg  po bid and methylphenidate 5mg  qam  D/C to SNF with Hospice   Chaplain consult to assist with completing advance directives  Goals of Care and Additional Recommendations:  Limitations on Scope of Treatment: No Surgical Procedures  Code Status:  DNR  Prognosis:   < 6 months d/t severe gangrene with osteomyelitis, patient declines surgical intervention, requests palliative measures only  Discharge Planning:  [place] with Hospice  Care plan was discussed with patient, son- Brenda Moore, and patient's attending MD.   Thank you for allowing the Palliative Medicine Team to assist in the care of this  patient.   Time In: 1000  Time Out: 1030 Total Time 30 mins Prolonged Time Billed No      Greater than 50%  of this time was spent counseling and  coordinating care related to the above assessment and plan.  Mariana Kaufman, AGNP-C Palliative Medicine   Please contact Palliative Medicine Team phone at [phone] for questions and concerns.

## 2016-12-17 LAB — GLUCOSE, CAPILLARY
GLUCOSE-CAPILLARY: 101 mg/dL — AB (ref 65–99)
Glucose-Capillary: 104 mg/dL — ABNORMAL HIGH (ref 65–99)
Glucose-Capillary: 161 mg/dL — ABNORMAL HIGH (ref 65–99)

## 2016-12-17 NOTE — Progress Notes (Signed)
Attempted to call for report at [facility] but no answer and mail box is full at extension [phone] and also tried [phone] but nobody answers.

## 2016-12-17 NOTE — Discharge Summary (Signed)
Brenda Moore, is a 61 y.o. female  DOB August 14, 1955  MRN [id number].  Admission date:  12/11/2016  Admitting Physician  Mariel Aloe, MD  Discharge Date:  12/17/2016   Primary MD  Patient, No Pcp Per   Addendum: No significant updates since discharge summary on 5/24. Discharge was pending insurance authorization which has been obtained today.  Recommendations for primary care physician for things to follow:  - Patient will need referral to hospice service upon her arrival to your facility - Please check CBC, BMP in 3 days.  CODE STATUS DO NOT RESUSCITATE   Admission Diagnosis  Dehydration [E86.0] Hyponatremia [E87.1] Hyperglycemia [R73.9] Osteomyelitis of right foot, unspecified type ([place]) [M86.9] Protein-calorie malnutrition, unspecified severity ([place]) [E46] Gangrene ([place]) [I96]   Discharge Diagnosis  Dehydration [E86.0] Hyponatremia [E87.1] Hyperglycemia [R73.9] Osteomyelitis of right foot, unspecified type ([place]) [M86.9] Protein-calorie malnutrition, unspecified severity ([place]) [E46] Gangrene ([place]) [I96]    Principal Problem:   Gangrene ([place]) Active Problems:   DKA (diabetic ketoacidoses) ([place])   Osteomyelitis ([place])   High anion gap metabolic acidosis   Adjustment disorder with depressed mood   Bacteremia   Advance care planning   Palliative care by specialist   Goals of care, counseling/discussion   Protein-calorie malnutrition ([place])   Chronic fatigue      History reviewed. No pertinent past medical history.  History reviewed. No pertinent surgical history.     History of present illness and  Hospital Course:     Kindly see H&P for history of present illness and admission details, please review complete Labs, Consult reports and Test reports for all details in brief  HPI  from the history and physical done on the day of admission 12/11/2016 HPI: Brenda Moore is a 61 y.o. female  with no significant medical history. About 2 months ago, patient noticed that she was weeping blood from her right foot. She was concerned that there is possibly some kind of "impurity" and decided to try Epsom salt to "draw out the impurities." Over time, she started noticing some purulent drainage emanating from her wound. She reports associated pain and was using Tylenol to help. She did not seek medical attention because she thought it would go away on its own.   ED Course: Vitals: Afebrile. Normal pulse. Normotensive. 97% on room air. Labs: Sodium of 128, glucose of 305, CO2 of 20, creatinine of 1.07, alkaline phosphatase of 149, albumin of 1.8, WBC of 15.7 K, hemoglobin of 11. Imaging: Foot x-ray significant for evidence of gas gangrene and osteomyelitis Medications/Course: Vancomycin, Zosyn, Tdap   Hospital Course  Brenda Moore is a 61 y.o. female with no past medical history presented with dry gangrene and osteomyelitis of her right foot. Patient is obviously very depressed but denies being depressed. No sepsis at this time. Started on empiric antibiotic therapy. Orthopedic surgery consulted and suggesting right limb amputation, however, patient is refusing. Psych consulted for evaluation. Infectious disease consulted since patient is refusing amputation.  Necrotic gangrene of right foot  - Discussed  at length with patient multiple times over last 48 hours, she still refusing amputation, informed her about risks of progression of infection, possible bacteremia and death, she still adamant about refusing surgery or amputation, was seen by psychiatry, Dr. Teryl Lucy  discussed with psychiatry on 5/21 and patient has capacity. -Transition to doxycycline, cipro and flagyl x2-4 weeks per ID recommendations, to finish total of 4 weeks -palliative care consult: Discharge to SNF, with hospice referral upon arrival  -PT: SNF  Diabetes mellitus, new onset with diabetic acidosis on admission  - This is  new diagnosis, anion gap closed, hemoglobin A1c is 14.2 . - CBG overall acceptable on current regimen, continue with Lantus 8 units, and insulin sliding scale  Depressed mood - Patient denies symptoms of depressed mood or history of depression. On evaluation, patient appears to be having depressed mood. Continue with Effexor and methylphenidate.  Anemia - Stable. Appears secondary to chronic disease. B12 significantly elevated. Initial hemoglobin likely secondary to hemoconcentration in setting of dehydration/DKA  Hyponatremia -  urine osmolality 335, urine sodium 56, serum osmolality 271, this is consistent with mild SIADH, significant improvement with fluid restriction, and on salt tablets, sodium is 133 on discharge, please recheck BMP in 3 days, if recurrent hyponatremia, reinitiate fluid restriction.     Discharge Condition:    < 6 months as long she is refusing surgery  Hospice to be consulted upon patient arrival to your facility   Follow UP  Contact information for after-discharge care    Destination    [facility] .   Specialty:  [place] information: Brenda Moore [phone]            SNF physician Hospice   Discharge Instructions  and  Discharge Medications   Discharge Instructions    Discharge instructions    Complete by:  As directed    Follow withPCP at facility - Patient will need referral to hospice service upon admission to facility  Get CBC, CMP,  checked  by Primary MD next visit.    Activity: As tolerated with Full fall precautions use walker/cane & assistance as needed   Disposition SNF   Diet: Heart Healthy , carbohydrate modified, with feeding assistance and aspiration precautions.   On your next visit with your primary care physician please Get Medicines reviewed and adjusted.   Please request your Prim.MD to go over all Hospital Tests and  Procedure/Radiological results at the follow up, please get all Hospital records sent to your Prim MD by signing hospital release before you go home.   If you experience worsening of your admission symptoms, develop shortness of breath, life threatening emergency, suicidal or homicidal thoughts you must seek medical attention immediately by calling 911 or calling your MD immediately  if symptoms less severe.  You Must read complete instructions/literature along with all the possible adverse reactions/side effects for all the Medicines you take and that have been prescribed to you. Take any new Medicines after you have completely understood and accpet all the possible adverse reactions/side effects.   Do not drive, operating heavy machinery, perform activities at heights, swimming or participation in water activities or provide baby sitting services if your were admitted for syncope or siezures until you have seen by Primary MD or a Neurologist and advised to do so again.  Do not drive when taking Pain medications.    Do not take more than prescribed Pain, Sleep and Anxiety Medications  Special Instructions:  If you have smoked or chewed Tobacco  in the last 2 yrs please stop smoking, stop any regular Alcohol  and or any Recreational drug use.  Wear Seat belts while driving.   Please note  You were cared for by a hospitalist during your hospital stay. If you have any questions about your discharge medications or the care you received while you were in the hospital after you are discharged, you can call the unit and asked to speak with the hospitalist on call if the hospitalist that took care of you is not available. Once you are discharged, your primary care physician will handle any further medical issues. Please note that NO REFILLS for any discharge medications will be authorized once you are discharged, as it is imperative that you return to your primary care physician (or establish a  relationship with a primary care physician if you do not have one) for your aftercare needs so that they can reassess your need for medications and monitor your lab values.   Discharge wound care:    Complete by:  As directed    Paint right foot with betadine and change dressing daily   Increase activity slowly    Complete by:  As directed      Allergies as of 12/17/2016      Reactions   Poison Oak Extract Rash      Medication List    TAKE these medications   acetaminophen 325 MG tablet Commonly known as:  TYLENOL Take 2 tablets (650 mg total) by mouth every 6 (six) hours as needed for mild pain (or Fever >/= 101).   ciprofloxacin 500 MG tablet Commonly known as:  CIPRO Take 1 tablet (500 mg total) by mouth 2 (two) times daily. Continue for 4 weeks   doxycycline 100 MG tablet Commonly known as:  VIBRA-TABS Take 1 tablet (100 mg total) by mouth every 12 (twelve) hours. Continue for 4 weeks   insulin aspart 100 UNIT/ML injection Commonly known as:  novoLOG Inject 0-5 Units into the skin at bedtime.   insulin aspart 100 UNIT/ML injection Commonly known as:  novoLOG Inject 0-9 Units into the skin 3 (three) times daily with meals.   insulin glargine 100 UNIT/ML injection Commonly known as:  LANTUS Inject 0.08 mLs (8 Units total) into the skin daily.   methylphenidate 5 MG tablet Commonly known as:  RITALIN Take 1 tablet (5 mg total) by mouth every morning.   metroNIDAZOLE 500 MG tablet Commonly known as:  FLAGYL Take 1 tablet (500 mg total) by mouth every 8 (eight) hours. Please take for 4 weeks   morphine 15 MG tablet Commonly known as:  MSIR Take 0.5 tablets (7.5 mg total) by mouth every 4 (four) hours as needed for severe pain.   multivitamin with minerals tablet Take 1 tablet by mouth daily.   saccharomyces boulardii 250 MG capsule Commonly known as:  FLORASTOR Take 1 capsule (250 mg total) by mouth 2 (two) times daily.   venlafaxine 37.5 MG tablet Commonly  known as:  EFFEXOR Take 1 tablet (37.5 mg total) by mouth 2 (two) times daily with a meal.         Diet and Activity recommendation: See Discharge Instructions above   Consults obtained -   Orthopedic surgery, Dr. Marlou Sa  Psychiatry, Dr. Darleene Cleaver  Infectious disease   Major procedures and Radiology Reports - PLEASE review detailed and final reports for all details, in brief -      Dg Foot Complete  Right  Result Date: 12/11/2016 CLINICAL DATA:  Necrosis of bilateral feet. EXAM: RIGHT FOOT COMPLETE - 3+ VIEW COMPARISON:  None. FINDINGS: Significant soft tissue gas identified overlying the calcaneus, the lateral midfoot, and numerous toes. There is significant bony destruction associated with the first proximal and distal phalanges. Involvement of the adjacent first metatarsal is also identified. The second through fourth toes and second through fourth metatarsals demonstrate no fracture or bony erosion. The tarsal bones are normal. Lucency along the posterosuperior calcaneus is suspicious for osteomyelitis based on the lateral view. An MRI could better assess the extent of osteomyelitis. IMPRESSION: 1. Diffuse soft tissue gas consistent with a gas-forming organism. 2. Significant destruction associated with the great toe and adjacent first metatarsal consistent with osteomyelitis. 3. Suspected osteomyelitis along the posterosuperior aspect of the calcaneus. An MRI could better assess the extent of osteomyelitis. Electronically Signed   By: Dorise Bullion III M.D   On: 12/11/2016 14:29    Micro Results     Recent Results (from the past 240 hour(s))  Blood culture (routine x 2)     Status: Abnormal   Collection Time: 12/11/16  2:18 PM  Result Value Ref Range Status   Specimen Description BLOOD LEFT ANTECUBITAL  Final   Special Requests   Final    BOTTLES DRAWN AEROBIC AND ANAEROBIC Blood Culture adequate volume   Culture  Setup Time   Final    GRAM POSITIVE COCCI IN CLUSTERS IN  BOTH AEROBIC AND ANAEROBIC BOTTLES CRITICAL RESULT CALLED TO, READ BACK BY AND VERIFIED WITH: C BALL,PHARMD AT 1011 12/12/16 BY L BENFIELD    Culture (A)  Final    STAPHYLOCOCCUS SPECIES (COAGULASE NEGATIVE) THE SIGNIFICANCE OF ISOLATING THIS ORGANISM FROM A SINGLE SET OF BLOOD CULTURES WHEN MULTIPLE SETS ARE DRAWN IS UNCERTAIN. PLEASE NOTIFY THE MICROBIOLOGY DEPARTMENT WITHIN ONE WEEK IF SPECIATION AND SENSITIVITIES ARE REQUIRED.    Report Status 12/14/2016 FINAL  Final   Organism ID, Bacteria STAPHYLOCOCCUS SPECIES (COAGULASE NEGATIVE)  Final      Susceptibility   Staphylococcus species (coagulase negative) - MIC*    CIPROFLOXACIN <=0.5 SENSITIVE Sensitive     ERYTHROMYCIN >=8 RESISTANT Resistant     GENTAMICIN <=0.5 SENSITIVE Sensitive     OXACILLIN <=0.25 SENSITIVE Sensitive     TETRACYCLINE <=1 SENSITIVE Sensitive     VANCOMYCIN 1 SENSITIVE Sensitive     TRIMETH/SULFA <=10 SENSITIVE Sensitive     CLINDAMYCIN <=0.25 SENSITIVE Sensitive     RIFAMPIN <=0.5 SENSITIVE Sensitive     Inducible Clindamycin NEGATIVE Sensitive     * STAPHYLOCOCCUS SPECIES (COAGULASE NEGATIVE)  Blood Culture ID Panel (Reflexed)     Status: Abnormal   Collection Time: 12/11/16  2:18 PM  Result Value Ref Range Status   Enterococcus species NOT DETECTED NOT DETECTED Final   Listeria monocytogenes NOT DETECTED NOT DETECTED Final   Staphylococcus species DETECTED (A) NOT DETECTED Final    Comment: Methicillin (oxacillin) resistant coagulase negative staphylococcus. Possible blood culture contaminant (unless isolated from more than one blood culture draw or clinical case suggests pathogenicity). No antibiotic treatment is indicated for blood  culture contaminants. CRITICAL RESULT CALLED TO, READ BACK BY AND VERIFIED WITH: C BALL,PHARMD AT 1011 12/12/16 BY L BENFIELD    Staphylococcus aureus NOT DETECTED NOT DETECTED Final   Methicillin resistance DETECTED (A) NOT DETECTED Final    Comment: CRITICAL RESULT CALLED  TO, READ BACK BY AND VERIFIED WITH: C BALL, PHARMD AT 1011 12/12/16 BY L BENFIELD  Streptococcus species NOT DETECTED NOT DETECTED Final   Streptococcus agalactiae NOT DETECTED NOT DETECTED Final   Streptococcus pneumoniae NOT DETECTED NOT DETECTED Final   Streptococcus pyogenes NOT DETECTED NOT DETECTED Final   Acinetobacter baumannii NOT DETECTED NOT DETECTED Final   Enterobacteriaceae species NOT DETECTED NOT DETECTED Final   Enterobacter cloacae complex NOT DETECTED NOT DETECTED Final   Escherichia coli NOT DETECTED NOT DETECTED Final   Klebsiella oxytoca NOT DETECTED NOT DETECTED Final   Klebsiella pneumoniae NOT DETECTED NOT DETECTED Final   Proteus species NOT DETECTED NOT DETECTED Final   Serratia marcescens NOT DETECTED NOT DETECTED Final   Haemophilus influenzae NOT DETECTED NOT DETECTED Final   Neisseria meningitidis NOT DETECTED NOT DETECTED Final   Pseudomonas aeruginosa NOT DETECTED NOT DETECTED Final   Candida albicans NOT DETECTED NOT DETECTED Final   Candida glabrata NOT DETECTED NOT DETECTED Final   Candida krusei NOT DETECTED NOT DETECTED Final   Candida parapsilosis NOT DETECTED NOT DETECTED Final   Candida tropicalis NOT DETECTED NOT DETECTED Final  Blood culture (routine x 2)     Status: None   Collection Time: 12/11/16  2:35 PM  Result Value Ref Range Status   Specimen Description BLOOD RIGHT FOREARM  Final   Special Requests   Final    BOTTLES DRAWN AEROBIC ONLY Blood Culture adequate volume   Culture NO GROWTH 5 DAYS  Final   Report Status 12/16/2016 FINAL  Final  MRSA PCR Screening     Status: None   Collection Time: 12/11/16  9:16 PM  Result Value Ref Range Status   MRSA by PCR NEGATIVE NEGATIVE Final    Comment:        The GeneXpert MRSA Assay (FDA approved for NASAL specimens only), is one component of a comprehensive MRSA colonization surveillance program. It is not intended to diagnose MRSA infection nor to guide or monitor treatment  for MRSA infections.   Culture, Urine     Status: None   Collection Time: 12/12/16 10:28 AM  Result Value Ref Range Status   Specimen Description URINE, RANDOM  Final   Special Requests NONE  Final   Culture NO GROWTH  Final   Report Status 12/14/2016 FINAL  Final       Today   Subjective:   Brenda Moore today has no headache,no chest or abdominal pain,Report generalized weakness and fatigue .  Objective:   Blood pressure 138/72, pulse 72, temperature 98 F (36.7 C), resp. rate 17, height 5\' 10"  (1.778 m), weight 67.5 kg (148 lb 13 oz), SpO2 98 %.   Intake/Output Summary (Last 24 hours) at 12/17/16 1243 Last data filed at 12/17/16 0846  Gross per 24 hour  Intake              240 ml  Output                0 ml  Net              240 ml    Exam Awake Alert, Oriented x 3,Judgment and thoughts content normal Symmetrical Chest wall movement, Good air movement bilaterally, CTAB RRR,No Gallops,Rubs or new Murmurs, No Parasternal Heave +ve B.Sounds, Abd Soft, Non tender, No rebound -guarding or rigidity. right foot with dressing in place, with seranguinous drainage and gangrenous odor noted, left toe with necrotic area in the left great toe  Data Review   CBC w Diff:  Lab Results  Component Value Date   WBC 8.5 12/16/2016  HGB 9.1 (L) 12/16/2016   HCT 28.7 (L) 12/16/2016   PLT 489 (H) 12/16/2016   LYMPHOPCT 11 12/11/2016   MONOPCT 4 12/11/2016   EOSPCT 0 12/11/2016   BASOPCT 0 12/11/2016    CMP:  Lab Results  Component Value Date   NA 133 (L) 12/16/2016   K 3.5 12/16/2016   CL 102 12/16/2016   CO2 23 12/16/2016   BUN 6 12/16/2016   CREATININE 0.88 12/16/2016   PROT 7.4 12/11/2016   ALBUMIN 1.8 (L) 12/11/2016   BILITOT 0.9 12/11/2016   ALKPHOS 149 (H) 12/11/2016   AST 29 12/11/2016   ALT 39 12/11/2016  .    [facility], Anaid Haney M.D on 12/17/2016 at 12:43 PM  [facility]   Office  [phone]

## 2016-12-17 NOTE — Progress Notes (Signed)
Patient for discharge to [facility], report was given to nurse Sharyn Lull, now awaiting transporter.

## 2016-12-17 NOTE — Clinical Social Work Placement (Signed)
   CLINICAL SOCIAL WORK PLACEMENT  NOTE  Date:  12/17/2016  Patient Details  Name: [name] MRN: [id number] Date of Birth: [birth year]  Clinical Social Work is seeking post-discharge placement for this patient at the [place] level of care (*CSW will initial, date and re-position this form in  chart as items are completed):  Yes   Patient/family provided with [facility] Work Department's list of facilities offering this level of care within the geographic area requested by the patient (or if unable, by the patient's family).  Yes   Patient/family informed of their freedom to choose among providers that offer the needed level of care, that participate in Medicare, Medicaid or managed care program needed by the patient, have an available bed and are willing to accept the patient.  Yes   Patient/family informed of [facility]'s ownership interest in [facility] and [facility], as well as of the fact that they are under no obligation to receive care at these facilities.  PASRR submitted to EDS on 12/15/16     PASRR number received on 12/17/16     Existing PASRR number confirmed on       FL2 transmitted to all facilities in geographic area requested by pt/family on 12/15/16     FL2 transmitted to all facilities within larger geographic area on       Patient informed that his/her managed care company has contracts with or will negotiate with certain facilities, including the following:            Patient/family informed of bed offers received.  Patient chooses bed at [facility]     Physician recommends and patient chooses bed at      Patient to be transferred to [facility] on 12/17/16.  Patient to be transferred to facility by [facility]     Patient family notified on 12/17/16 of transfer.  Name of family member notified:  Brenda Moore     PHYSICIAN Please prepare prescriptions     Additional Comment:     _______________________________________________ Truitt Merle, LCSW 12/17/2016, 5:18 PM

## 2016-12-17 NOTE — Progress Notes (Signed)
Physical Therapy Treatment Patient Details Name: Brenda Moore MRN: [id number] DOB: 10/27/1955 Today's Date: 12/17/2016    History of Present Illness Pt is a 61 yo female with R foot diabetic ulcer and osteomyletis resulting in gangrene. Pt advised of benefit of amputation in decreasing the risk of infection spreading. Pt has refused and is currently seeking palliative care. PMH significant for DKA, DM, depression and adjustment disorder    PT Comments    Pt continues to be limited by lethargy and depression but did make one joke today and briefly smiled. Pt required maximal encouragement to participate in PT. Pt was min guard for bed mobility, sat at EoB with only feet supported for 3 minutes and transfers with min guard and walked 5 feet with RW with min guard. Pt requires skilled PT to progress gait training and for overall strengthening and to improve endurance to safely navigate her discharge environment.    Follow Up Recommendations  Supervision - Intermittent;SNF     Equipment Recommendations  Rolling walker with 5" wheels;3in1 (PT)    Recommendations for Other Services OT consult     Precautions / Restrictions Restrictions Weight Bearing Restrictions: No    Mobility  Bed Mobility Overal bed mobility: Needs Assistance Bed Mobility: Supine to Sit     Supine to sit: HOB elevated;Min guard     General bed mobility comments: pt with good hand placement to pull self to EoB without vc today  Transfers Overall transfer level: Needs assistance Equipment used: Rolling walker (2 wheeled) Transfers: Sit to/from Stand Sit to Stand: Min guard         General transfer comment: pt with good power up and stedying in RW  Ambulation/Gait Ambulation/Gait assistance: Min guard Ambulation Distance (Feet): 5 Feet Assistive device: Rolling walker (2 wheeled) Gait Pattern/deviations: Step-to pattern;Shuffle;Decreased stance time - right;Decreased weight shift to right Gait velocity:  slowed Gait velocity interpretation: Below normal speed for age/gender General Gait Details: pt gait less antalgic today with more equalized weight shifting, pt refused to walk further due to being tired     Balance Overall balance assessment: Needs assistance Sitting-balance support: No upper extremity supported;Feet unsupported Sitting balance-Leahy Scale: Fair Sitting balance - Comments: able to balance EoB, pt agreed to sit EoB with only feet supported for 3 minutes before walking to chair   Standing balance support: Bilateral upper extremity supported Standing balance-Leahy Scale: Fair Standing balance comment: requires RW support                            Cognition Arousal/Alertness: Lethargic Behavior During Therapy: Flat affect Overall Cognitive Status: No family/caregiver present to determine baseline cognitive functioning                                 General Comments: pt requires maximal encouragement to participate in therapy today             Pertinent Vitals/Pain Pain Assessment: Faces Faces Pain Scale: Hurts a little bit Pain Location: R foot Pain Descriptors / Indicators: Constant;Burning Pain Intervention(s): Monitored during session;Repositioned           PT Goals (current goals can now be found in the care plan section) Acute Rehab PT Goals Patient Stated Goal: go home PT Goal Formulation: With patient Time For Goal Achievement: 12/28/16 Potential to Achieve Goals: Poor Progress towards PT goals: Progressing toward goals  Frequency    Min 3X/week      PT Plan Current plan remains appropriate       AM-PAC PT "6 Clicks" Daily Activity  Outcome Measure  Difficulty turning over in bed (including adjusting bedclothes, sheets and blankets)?: A Lot Difficulty moving from lying on back to sitting on the side of the bed? : A Lot Difficulty sitting down on and standing up from a chair with arms (e.g., wheelchair,  bedside commode, etc,.)?: A Lot Help needed moving to and from a bed to chair (including a wheelchair)?: A Little Help needed walking in hospital room?: A Little Help needed climbing 3-5 steps with a railing? : Total 6 Click Score: 13    End of Session Equipment Utilized During Treatment: Gait belt Activity Tolerance: Patient limited by fatigue;Patient limited by lethargy Patient left: in chair;with call bell/phone within reach;with chair alarm set Nurse Communication: Mobility status PT Visit Diagnosis: Unsteadiness on feet (R26.81);Other abnormalities of gait and mobility (R26.89);Muscle weakness (generalized) (M62.81);Pain Pain - Right/Left: Right Pain - part of body: Ankle and joints of foot     Time: 1105-1116 PT Time Calculation (min) (ACUTE ONLY): 11 min  Charges:  $Therapeutic Activity: 8-22 mins                    G Codes:       Davell Beckstead B. Migdalia Dk PT, DPT Acute Rehabilitation  [phone] Pager [phone]     [place] 12/17/2016, 2:49 PM

## 2016-12-17 NOTE — Clinical Social Work Note (Signed)
Clinical Social Work Assessment  Patient Details  Name: Brenda Moore MRN: [id number] Date of Birth: [birth year]  Date of referral:  12/17/16               Reason for consult:  Discharge Planning                Permission sought to share information with:  Facility Sport and exercise psychologist, Family Supports Permission granted to share information::  Yes, Verbal Permission Granted  Name::     Brenda Moore  Agency::  SNFs  Relationship::  Son  Contact Information:  [phone]  Housing/Transportation Living arrangements for the past 2 months:  [place] of Information:  Patient, Adult Children Patient Interpreter Needed:  None Criminal Activity/Legal Involvement Pertinent to Current Situation/Hospitalization:  No - Comment as needed Significant Relationships:  Adult Children Lives with:  Self Do you feel safe going back to the place where you live?  Yes Need for family participation in patient care:  Yes (Comment)  Care giving concerns:  No care giving concerns identified.    Social Worker assessment / plan:  CSW met with pt to address consult for new SNF. Son-Anson Raneri present at bedside. CSW introduced herself and explained role of social work. Pt from home and lives alone. P/T is recommending STR at SNF. CSW explained discharging to SNF with a Medicare. CSW provided SNF listing for review. Pt agreeable to SNF for STR. Family prefers [facility], but agreed for CSW to fax out to [facility].    CSW will sent FL-2 to SNFs and confirmed bed offer with [facility]. Pt is ready for discharge today and will go to [facility]. Pt and son are aware and agreeable to discharge plan. CSW sent clinicals to [facility] and communicated with Kirsten (admissions) for room and report. [facility] received insurance auth. CSW received PASRR number: [id number] E after being under  manual review.  Room and report provided to RN and put in treatment team sticky note. Transportation arranged with [facility]. CSW  is signing off as no further needs identified.   Employment status:  Disabled (Comment on whether or not currently receiving Disability) Insurance information:  Other (Comment Required) Nurse, mental health) PT Recommendations:  [place] / Referral to community resources:  [place]  Patient/Family's Response to care:  Pt and son appreciative of CSW guidance and support.   Patient/Family's Understanding of and Emotional Response to Diagnosis, Current Treatment, and Prognosis:  Pt is accepting of her diagnosis, current treatment plan, and prognosis.    Emotional Assessment Appearance:  Appears stated age Attitude/Demeanor/Rapport:  Other (Appropriate) Affect (typically observed):  Accepting Orientation:  Oriented to Self, Oriented to Place, Oriented to  Time, Oriented to Situation Alcohol / Substance use:  Never Used Psych involvement (Current and /or in the community):  No (Comment)  Discharge Needs  Concerns to be addressed:  Care Coordination, Adjustment to Illness Readmission within the last 30 days:  No Current discharge risk:  Dependent with Mobility, Lives alone Barriers to Discharge:  Continued Medical Work up   [employer], LCSW 12/17/2016, 5:22 PM

## 2016-12-21 ENCOUNTER — Encounter: Payer: Self-pay | Admitting: Adult Health

## 2016-12-21 ENCOUNTER — Non-Acute Institutional Stay (SKILLED_NURSING_FACILITY): Payer: BLUE CROSS/BLUE SHIELD | Admitting: Adult Health

## 2016-12-21 DIAGNOSIS — E43 Unspecified severe protein-calorie malnutrition: Secondary | ICD-10-CM | POA: Diagnosis not present

## 2016-12-21 DIAGNOSIS — D638 Anemia in other chronic diseases classified elsewhere: Secondary | ICD-10-CM | POA: Diagnosis not present

## 2016-12-21 DIAGNOSIS — M869 Osteomyelitis, unspecified: Secondary | ICD-10-CM

## 2016-12-21 DIAGNOSIS — I96 Gangrene, not elsewhere classified: Secondary | ICD-10-CM

## 2016-12-21 DIAGNOSIS — F4321 Adjustment disorder with depressed mood: Secondary | ICD-10-CM

## 2016-12-21 DIAGNOSIS — E1110 Type 2 diabetes mellitus with ketoacidosis without coma: Secondary | ICD-10-CM

## 2016-12-21 NOTE — Progress Notes (Signed)
Location:   [facility] Room Number: 102-P Place of Service:  SNF (31)   CODE STATUS: dnr  Allergies  Allergen Reactions  . Poison Oak Extract Rash    Chief Complaint  Patient presents with  . Hospitalization Follow-up    HPI:  She has been hospitalized for gangrene and osteomyelitis of her right foot. She has new onset diabetes; anemia of chronic disease. She had developed a wound on her right foot; began soaking the foot in epsom salts; developed further infection and was hospitalized. She has declined an amputation of the leg a this time. She is here for short term rehab with her goal to return back home. There are no nursing concerns at this time.     Past Medical History:  Diagnosis Date  . Diabetes mellitus without complication ([place])   . Osteomyelitis of right foot ([place])    No past surgical history on file.  Social History   Social History  . Marital status: Divorced    Spouse name: N/A  . Number of children: N/A  . Years of education: N/A   Occupational History  . Not on file.   Social History Main Topics  . Smoking status: Former Smoker    Packs/day: 0.75    Years: 40.00  . Smokeless tobacco: Never Used  . Alcohol use No  . Drug use: No  . Sexual activity: Not on file   Other Topics Concern  . Not on file   Social History Narrative  . No narrative on file   Family History  Problem Relation Age of Onset  . Lymphoma Mother   . Heart disease Mother   . Prostate cancer Father   . Skin cancer Father   . Stroke Father   . Seizures Father   . Epilepsy Brother   . Prostate cancer Brother       VITAL SIGNS BP 124/65   Pulse 81   Temp 98.5 F (36.9 C) (Oral)   Resp 18   Ht 5' 10"  (1.778 m)   Wt 142 lb 3.2 oz (64.5 kg)   LMP  (LMP Unknown)   SpO2 97%   BMI 20.40 kg/m    Patient's Medications   New Prescriptions   No medications on file   Previous Medications   ACETAMINOPHEN (TYLENOL) 325 MG TABLET    Take 2 tablets  (650 mg total) by mouth every 6 (six) hours as needed for mild pain (or Fever >/= 101).   CIPROFLOXACIN (CIPRO) 500 MG TABLET    Take 1 tablet (500 mg total) by mouth 2 (two) times daily. Continue for 4 weeks   DOXYCYCLINE (VIBRA-TABS) 100 MG TABLET    Take 1 tablet (100 mg total) by mouth every 12 (twelve) hours. Continue for 4 weeks   INSULIN ASPART (NOVOLOG) 100 UNIT/ML INJECTION    Inject 0-5 Units into the skin at bedtime.   INSULIN ASPART (NOVOLOG) 100 UNIT/ML INJECTION    Inject 0-9 Units into the skin 3 (three) times daily with meals.   INSULIN GLARGINE (LANTUS) 100 UNIT/ML INJECTION    Inject 0.08 mLs (8 Units total) into the skin daily.   METRONIDAZOLE (FLAGYL) 500 MG TABLET    Take 1 tablet (500 mg total) by mouth every 8 (eight) hours. Please take for 4 weeks   MORPHINE (MSIR) 15 MG TABLET    Take 0.5 tablets (7.5 mg total) by mouth every 4 (four) hours as needed for severe pain.   Ritalin 5  mg daily  Take 5 mg daily    MULTIPLE VITAMINS-MINERALS (MULTIVITAMIN WITH MINERALS) TABLET    Take 1 tablet by mouth daily.   SACCHAROMYCES BOULARDII (FLORASTOR) 250 MG CAPSULE    Take 1 capsule (250 mg total) by mouth 2 (two) times daily.   VENLAFAXINE (EFFEXOR) 37.5 MG TABLET    Take 1 tablet (37.5 mg total) by mouth 2 (two) times daily with a meal.   Modified Medications   No medications on file   Discontinued Medications     SIGNIFICANT DIAGNOSTIC EXAMS  12-11-16: right foot x-ray: 1. Diffuse soft tissue gas consistent with a gas-forming organism. 2. Significant destruction associated with the great toe and adjacent first metatarsal consistent with osteomyelitis. 3. Suspected osteomyelitis along the posterosuperior aspect of the calcaneus.  LABS REVIEWED:   12-11-16: wbc 15.7; hgb 11.0; hct 33.7; mcv 82.2; plt 406; glucose 305; bun 18; creat 1.07; k+ 4.0; na++ 128; ca 9.2; alk phos 149; albumin 1.8; pre-albumin <5; HIV: nr;  12-12-16: wbc 14.1; hgb 8.2; hct 26.0; mcv 81.3; plt 378;  glucose 163; bun 12; creat 0.76; k+ 3.4; na++ 132; ca 8.3; hgb a1c 14.2; vit B 12: 5149; folate 18.4; urine culture: no growth; iron 16; tibc 171; ferritin 941 12-16-16: wbc 8.5; hgb 9.1; hct 28.7 ;mcv 82.5; plt 489; glucose 96; bun 6; creat 0.88; k+ 3.5 ;na++ 133; ca 8.3     Review of Systems  Constitutional: Negative for malaise/fatigue.  Respiratory: Negative for cough and shortness of breath.   Cardiovascular: Negative for chest pain, palpitations and leg swelling.  Gastrointestinal: Negative for abdominal pain, constipation and heartburn.  Musculoskeletal: Negative for back pain, joint pain and myalgias.  Skin: Negative.   Neurological: Negative for dizziness.  Psychiatric/Behavioral: The patient is not nervous/anxious and does not have insomnia.     Physical Exam  Constitutional: She is oriented to person, place, and time. No distress.  Thin   Eyes: Conjunctivae are normal.  Neck: Neck supple. No JVD present. No thyromegaly present.  Cardiovascular: Normal rate, regular rhythm and normal heart sounds.   Respiratory: Effort normal and breath sounds normal. No respiratory distress. She has no wheezes.  GI: Soft. Bowel sounds are normal. She exhibits no distension. There is no tenderness.  Musculoskeletal: She exhibits no edema.  Able to move all extremities   Lymphadenopathy:    She has no cervical adenopathy.  Neurological: She is alert and oriented to person, place, and time.  Skin: Skin is warm and dry. She is not diaphoretic.  Right foot with dressing intact; foot has a foul odor present.   Psychiatric: She has a normal mood and affect.    ASSESSMENT/ PLAN:  1. Diabetes: new onset hgb a1c 14.2; will continue lantus 8 units and novolog SSI  2. 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 Will monitor her na++ level and will reinstate fluid  restriction as indicated.   4. Depression: will continue ritalin 5 mg daily and effexor 37.5 mg twice daily   5. Necrotic gangrene of right foot: has osteomyelitis  was seen by I/D and surgery; she has declined amputation of her right leg. Will continue cipro 500 mg twice daily; doxycycline 100 mg twice daily flagyl 500 mg every 8 hours all for 4 weeks. She has MSIR 7.5 mg every 4 hours as needed for pain.  6. Severe malnutrition: albumin 1.9 will begin prostat 30 cc three times daily and will start med pass 90 cc twice daily between meals.   She will need a palliative care consult will transition to hospice care once she has completed her therapy.   Will check cbc; cmp    Time spent with patient  50  minutes >50% time spent counseling; reviewing medical record; tests; labs; and developing future plan of care   MD is aware of resident's narcotic use and is in agreement with current plan of care. We will attempt to wean resident as apropriate   Ok Edwards NP [facility]  Contact [phone] Monday through Friday 8am- 5pm  After hours call [phone]

## 2016-12-22 ENCOUNTER — Non-Acute Institutional Stay (SKILLED_NURSING_FACILITY): Payer: BLUE CROSS/BLUE SHIELD | Admitting: Internal Medicine

## 2016-12-22 ENCOUNTER — Encounter: Payer: Self-pay | Admitting: Internal Medicine

## 2016-12-22 DIAGNOSIS — E1140 Type 2 diabetes mellitus with diabetic neuropathy, unspecified: Secondary | ICD-10-CM

## 2016-12-22 DIAGNOSIS — F4321 Adjustment disorder with depressed mood: Secondary | ICD-10-CM | POA: Diagnosis not present

## 2016-12-22 DIAGNOSIS — D638 Anemia in other chronic diseases classified elsewhere: Secondary | ICD-10-CM

## 2016-12-22 DIAGNOSIS — M869 Osteomyelitis, unspecified: Secondary | ICD-10-CM | POA: Diagnosis not present

## 2016-12-22 DIAGNOSIS — E1110 Type 2 diabetes mellitus with ketoacidosis without coma: Secondary | ICD-10-CM | POA: Diagnosis not present

## 2016-12-22 DIAGNOSIS — E1040 Type 1 diabetes mellitus with diabetic neuropathy, unspecified: Secondary | ICD-10-CM | POA: Insufficient documentation

## 2016-12-22 NOTE — Progress Notes (Signed)
Location:  [place] Room Number: 102-P Place of Service:  SNF (31) Provider:  Blanchie Serve  Patient, No Pcp Per  Patient Care Team: Patient, No Pcp Per as PCP - General (General Practice)  Extended Emergency Contact Information Primary Emergency Contact: Lorenza Evangel[place] Mobile Phone: [phone] Relation: Son Secondary Emergency Contact: [place] Work Phone: [phone] Relation: Son  Code Status:  DNR Goals of care: Advanced Directive information Advanced Directives 12/21/2016  Does Patient Have a Medical Advance Directive? Yes  Type of Advance Directive Out of facility DNR (pink MOST or yellow form)  Does patient want to make changes to medical advance directive? No - Patient declined  Would patient like information on creating a medical advance directive? -     Chief Complaint  Patient presents with  . New Admit To SNF    New Admission Visit     HPI:  Pt is a 61 y.o. female seen today for New admit to the SNF for therapy and ? Hospice. Patient does not have any PMH and was not taking any meds. She had noticed weeping from her right foot many months ago. According to her brother actually near thanksgiving last year. She did not get medical attention inspite of her family insisting her to go. This time her son called the EMS and she was taken to the ER. There she was found to have dry gangrene and Osteomyelitis of her right foot. Infectious diseuse started her on Antibiotics. Ortho was consulted and they recommended Below Knee amputation. Patient refused amputation. She was told that she can die with the gangrene but she continues to refuse any surgical amputation. She was evaluated by Psych and they think she is capable of making her own decisions.  Patient is stable in the facility. She is not having any fever or chills. Her pain is controlled. She did get little dizzy while doing Therapy  but otherwise is able to tolerate therapy. She also want sot know if she can get Morphine at night so she can sleep well.     Past Medical History:  Diagnosis Date  . Anemia   . Diabetes mellitus without complication ([place])   . Osteomyelitis of right foot ([place])    No past surgical history on file.  Allergies  Allergen Reactions  . Poison Oak Extract Rash    Outpatient Encounter Prescriptions as of 12/22/2016  Medication Sig  . acetaminophen (TYLENOL) 325 MG tablet Take 2 tablets (650 mg total) by mouth every 6 (six) hours as needed for mild pain (or Fever >/= 101).  . Amino Acids-Protein Hydrolys (FEEDING SUPPLEMENT, PRO-STAT SUGAR FREE 64,) LIQD Take 30 mLs by mouth 3 (three) times daily.  . calcium carbonate (TUMS - DOSED IN MG ELEMENTAL CALCIUM) 500 MG chewable tablet Chew 1 tablet by mouth every 4 (four) hours as needed for indigestion or heartburn.  . ciprofloxacin (CIPRO) 500 MG tablet Take 1 tablet (500 mg total) by mouth 2 (two) times daily. Continue for 4 weeks  . doxycycline (VIBRA-TABS) 100 MG tablet Take 1 tablet (100 mg total) by mouth every 12 (twelve) hours. Continue for 4 weeks  . ferrous sulfate 325 (65 FE) MG tablet Take 325 mg by mouth daily with breakfast.  . insulin aspart (NOVOLOG) 100 UNIT/ML injection Inject 0-5 Units into the skin at bedtime.  . insulin aspart (NOVOLOG) 100 UNIT/ML injection Inject 0-9 Units into the skin 3 (three) times daily  with meals.  . insulin glargine (LANTUS) 100 UNIT/ML injection Inject 0.08 mLs (8 Units total) into the skin daily.  . methylphenidate (RITALIN) 5 MG tablet Take 5 mg by mouth daily.  . metroNIDAZOLE (FLAGYL) 500 MG tablet Take 1 tablet (500 mg total) by mouth every 8 (eight) hours. Please take for 4 weeks  . morphine (MSIR) 15 MG tablet Take 0.5 tablets (7.5 mg total) by mouth every 4 (four) hours as needed for severe pain.  . Multiple Vitamins-Minerals (MULTIVITAMIN WITH MINERALS) tablet Take 1 tablet by mouth daily.  Marland Kitchen  saccharomyces boulardii (FLORASTOR) 250 MG capsule Take 1 capsule (250 mg total) by mouth 2 (two) times daily.  Marland Kitchen UNABLE TO FIND Med Name: Med pass 120 mL by mouth 2 times daily  . venlafaxine (EFFEXOR) 37.5 MG tablet Take 37.5 mg by mouth daily.  . [DISCONTINUED] venlafaxine (EFFEXOR) 37.5 MG tablet Take 1 tablet (37.5 mg total) by mouth 2 (two) times daily with a meal.   No facility-administered encounter medications on file as of 12/22/2016.     Review of Systems  Constitutional: Positive for activity change, appetite change and fatigue. Negative for chills, diaphoresis and fever.  HENT: Negative.   Respiratory: Negative.   Cardiovascular: Negative.   Gastrointestinal: Negative.   Genitourinary: Negative.   Musculoskeletal: Negative.   Skin: Positive for wound. Negative for color change, pallor and rash.  Neurological: Positive for dizziness and weakness. Negative for syncope, light-headedness, numbness and headaches.  Psychiatric/Behavioral: Positive for dysphoric mood and sleep disturbance. Negative for agitation, confusion, decreased concentration, hallucinations and suicidal ideas. The patient is not nervous/anxious.         Immunization History  Administered Date(s) Administered  . Tdap 12/11/2016   Pertinent  Health Maintenance Due  Topic Date Due  . FOOT EXAM  11/02/1966  . OPHTHALMOLOGY EXAM  11/02/1966  . URINE MICROALBUMIN  11/02/1966  . PAP SMEAR  11/01/1977  . MAMMOGRAM  11/02/2006  . COLONOSCOPY  11/02/2006  . INFLUENZA VACCINE  02/23/2017  . HEMOGLOBIN A1C  06/14/2017   No flowsheet data found. Functional Status Survey:    Vitals:   12/22/16 1000  BP: 122/67  Pulse: 85  Resp: 16  Temp: 98 F (36.7 C)  TempSrc: Oral  SpO2: 99%  Weight: 142 lb 3.2 oz (64.5 kg)  Height: 5\' 10"  (1.778 m)   Body mass index is 20.4 kg/m. Physical Exam  Constitutional: She is oriented to person, place, and time. She appears well-developed and well-nourished.  HENT:    Head: Normocephalic.  Mouth/Throat: Oropharynx is clear and moist.  Eyes: Pupils are equal, round, and reactive to light.  Neck: Neck supple.  Cardiovascular: Normal rate and regular rhythm.   Murmur heard. Pulmonary/Chest: Effort normal. No respiratory distress. She has no wheezes. She has no rales.  Abdominal: Soft. Bowel sounds are normal. She exhibits no distension. There is no tenderness.  Musculoskeletal:  Edema B/l  Neurological: She is alert and oriented to person, place, and time.  Skin: Skin is warm and dry.  Was unable to see her wound due to dressings. Will d/w wound care nurse. But room smells due to her wound   Psychiatric: Thought content normal. Her speech is delayed. She is slowed and withdrawn. Cognition and memory are impaired. She exhibits a depressed mood.    Labs reviewed:  Recent Labs  12/14/16 0607 12/15/16 0505 12/16/16 0345  NA 129* 129* 133*  K 3.4* 3.5 3.5  CL 101 100* 102  CO2 20*  21* 23  GLUCOSE 199* 119* 96  BUN 9 7 6   CREATININE 0.84 0.82 0.88  CALCIUM 8.0* 8.2* 8.3*    Recent Labs  12/11/16 1429  AST 29  ALT 39  ALKPHOS 149*  BILITOT 0.9  PROT 7.4  ALBUMIN 1.8*    Recent Labs  12/11/16 1429 12/12/16 0550 12/13/16 0318 12/16/16 0345  WBC 15.7* 14.1* 12.7* 8.5  NEUTROABS 13.2*  --   --   --   HGB 11.0* 8.2* 8.3* 9.1*  HCT 33.7* 26.0* 26.1* 28.7*  MCV 82.2 81.3 81.3 82.5  PLT 406* 378 383 489*   No results found for: TSH Lab Results  Component Value Date   HGBA1C 14.2 (H) 12/12/2016   No results found for: CHOL, HDL, LDLCALC, LDLDIRECT, TRIG, CHOLHDL  Significant Diagnostic Results in last 30 days:  Dg Foot Complete Right  Result Date: 12/11/2016 CLINICAL DATA:  Necrosis of bilateral feet. EXAM: RIGHT FOOT COMPLETE - 3+ VIEW COMPARISON:  None. FINDINGS: Significant soft tissue gas identified overlying the calcaneus, the lateral midfoot, and numerous toes. There is significant bony destruction associated with the first  proximal and distal phalanges. Involvement of the adjacent first metatarsal is also identified. The second through fourth toes and second through fourth metatarsals demonstrate no fracture or bony erosion. The tarsal bones are normal. Lucency along the posterosuperior calcaneus is suspicious for osteomyelitis based on the lateral view. An MRI could better assess the extent of osteomyelitis. IMPRESSION: 1. Diffuse soft tissue gas consistent with a gas-forming organism. 2. Significant destruction associated with the great toe and adjacent first metatarsal consistent with osteomyelitis. 3. Suspected osteomyelitis along the posterosuperior aspect of the calcaneus. An MRI could better assess the extent of osteomyelitis. Electronically Signed   By: Dorise Bullion III M.D   On: 12/11/2016 14:29    Assessment/Plan Osteomyelitis of right foot Patient is on Cipro, Flagyl and doxycycline for 4 weeks. I had long discussion with her and her brother. She refuses to have any surgery done. She wants to continue Antibiotics as she does not wan tto die right away. She has very supportive family but they say she has always been like this. Will continue her on Antibiotics and get Hospice referral as she would not live more then 6 months with gangrenous foot.  Type 2 diabetes mellitus  This also new diagnosis for patient . Her A1 C was 14.2 in the hospital. Her BS in facility are sometimes more then 300 and sometimes in 100 Will increase her Lantus to 10 units. Will continue Accu checks.  Adjustment disorder with depressed mood Continue Effexor and ritalin .  Anemia, chronic disease Hgb Stable continue to monitor    Family/ staff Communication: D/W Brother and Patient. Her prognosis stays grim unless she agrees for amputation. Patient is well aware. She is planning to go and live with her parents who are in their 21.  Will get hospice consult.  Labs/tests ordered:  BMP,CBC  Total time spent in this patient care  encounter was 45_ minutes; greater than 50% of the visit spent counseling patient and coordinating care for problems addressed at this encounter.

## 2017-03-10 ENCOUNTER — Inpatient Hospital Stay (HOSPITAL_COMMUNITY): Payer: Medicaid Other

## 2017-03-10 ENCOUNTER — Inpatient Hospital Stay (HOSPITAL_COMMUNITY)
Admission: EM | Admit: 2017-03-10 | Discharge: 2017-03-12 | DRG: 300 | Disposition: A | Discharge status: Skilled Nursing Facility | Payer: Medicaid Other | Attending: Family Medicine | Admitting: Family Medicine

## 2017-03-10 ENCOUNTER — Encounter (HOSPITAL_COMMUNITY): Payer: Self-pay | Admitting: *Deleted

## 2017-03-10 DIAGNOSIS — Z66 Do not resuscitate: Secondary | ICD-10-CM | POA: Diagnosis present

## 2017-03-10 DIAGNOSIS — Z89422 Acquired absence of other left toe(s): Secondary | ICD-10-CM | POA: Diagnosis not present

## 2017-03-10 DIAGNOSIS — M869 Osteomyelitis, unspecified: Secondary | ICD-10-CM | POA: Diagnosis not present

## 2017-03-10 DIAGNOSIS — L97529 Non-pressure chronic ulcer of other part of left foot with unspecified severity: Secondary | ICD-10-CM | POA: Diagnosis not present

## 2017-03-10 DIAGNOSIS — Z794 Long term (current) use of insulin: Secondary | ICD-10-CM

## 2017-03-10 DIAGNOSIS — L97519 Non-pressure chronic ulcer of other part of right foot with unspecified severity: Secondary | ICD-10-CM

## 2017-03-10 DIAGNOSIS — E1040 Type 1 diabetes mellitus with diabetic neuropathy, unspecified: Secondary | ICD-10-CM | POA: Diagnosis present

## 2017-03-10 DIAGNOSIS — L97509 Non-pressure chronic ulcer of other part of unspecified foot with unspecified severity: Secondary | ICD-10-CM

## 2017-03-10 DIAGNOSIS — Z87891 Personal history of nicotine dependence: Secondary | ICD-10-CM | POA: Diagnosis not present

## 2017-03-10 DIAGNOSIS — E1140 Type 2 diabetes mellitus with diabetic neuropathy, unspecified: Secondary | ICD-10-CM | POA: Diagnosis present

## 2017-03-10 DIAGNOSIS — E1152 Type 2 diabetes mellitus with diabetic peripheral angiopathy with gangrene: Secondary | ICD-10-CM | POA: Diagnosis present

## 2017-03-10 DIAGNOSIS — I96 Gangrene, not elsewhere classified: Secondary | ICD-10-CM | POA: Diagnosis present

## 2017-03-10 DIAGNOSIS — M868X7 Other osteomyelitis, ankle and foot: Secondary | ICD-10-CM | POA: Diagnosis present

## 2017-03-10 DIAGNOSIS — T148XXA Other injury of unspecified body region, initial encounter: Secondary | ICD-10-CM

## 2017-03-10 DIAGNOSIS — E1169 Type 2 diabetes mellitus with other specified complication: Secondary | ICD-10-CM | POA: Diagnosis present

## 2017-03-10 DIAGNOSIS — E11621 Type 2 diabetes mellitus with foot ulcer: Secondary | ICD-10-CM | POA: Diagnosis present

## 2017-03-10 DIAGNOSIS — F329 Major depressive disorder, single episode, unspecified: Secondary | ICD-10-CM | POA: Diagnosis present

## 2017-03-10 DIAGNOSIS — E1165 Type 2 diabetes mellitus with hyperglycemia: Secondary | ICD-10-CM | POA: Diagnosis present

## 2017-03-10 DIAGNOSIS — E860 Dehydration: Secondary | ICD-10-CM | POA: Diagnosis present

## 2017-03-10 DIAGNOSIS — L089 Local infection of the skin and subcutaneous tissue, unspecified: Secondary | ICD-10-CM

## 2017-03-10 LAB — COMPREHENSIVE METABOLIC PANEL
ALK PHOS: 84 U/L (ref 38–126)
ALT: 12 U/L — AB (ref 14–54)
AST: 19 U/L (ref 15–41)
Albumin: 3.5 g/dL (ref 3.5–5.0)
Anion gap: 9 (ref 5–15)
BILIRUBIN TOTAL: 0.7 mg/dL (ref 0.3–1.2)
BUN: 27 mg/dL — ABNORMAL HIGH (ref 6–20)
CALCIUM: 9.6 mg/dL (ref 8.9–10.3)
CO2: 27 mmol/L (ref 22–32)
CREATININE: 0.84 mg/dL (ref 0.44–1.00)
Chloride: 102 mmol/L (ref 101–111)
Glucose, Bld: 121 mg/dL — ABNORMAL HIGH (ref 65–99)
Potassium: 4.1 mmol/L (ref 3.5–5.1)
Sodium: 138 mmol/L (ref 135–145)
TOTAL PROTEIN: 8.2 g/dL — AB (ref 6.5–8.1)

## 2017-03-10 LAB — CBC WITH DIFFERENTIAL/PLATELET
Basophils Absolute: 0.1 10*3/uL (ref 0.0–0.1)
Basophils Relative: 1 %
EOS PCT: 3 %
Eosinophils Absolute: 0.3 10*3/uL (ref 0.0–0.7)
HEMATOCRIT: 40.2 % (ref 36.0–46.0)
HEMOGLOBIN: 13.5 g/dL (ref 12.0–15.0)
LYMPHS ABS: 3.6 10*3/uL (ref 0.7–4.0)
LYMPHS PCT: 38 %
MCH: 29 pg (ref 26.0–34.0)
MCHC: 33.6 g/dL (ref 30.0–36.0)
MCV: 86.3 fL (ref 78.0–100.0)
Monocytes Absolute: 0.5 10*3/uL (ref 0.1–1.0)
Monocytes Relative: 5 %
Neutro Abs: 5.2 10*3/uL (ref 1.7–7.7)
Neutrophils Relative %: 53 %
PLATELETS: 448 10*3/uL — AB (ref 150–400)
RBC: 4.66 MIL/uL (ref 3.87–5.11)
RDW: 14.3 % (ref 11.5–15.5)
WBC: 9.6 10*3/uL (ref 4.0–10.5)

## 2017-03-10 LAB — C-REACTIVE PROTEIN: CRP: 3.6 mg/dL — ABNORMAL HIGH (ref <1.0)

## 2017-03-10 LAB — SEDIMENTATION RATE: SED RATE: 90 mm/h — AB (ref 0–22)

## 2017-03-10 MED ORDER — SODIUM CHLORIDE 0.9% FLUSH
3.0000 mL | Freq: Two times a day (BID) | INTRAVENOUS | Status: DC
  Administered 2017-03-10 – 2017-03-12 (×4): 3 mL via INTRAVENOUS

## 2017-03-10 MED ORDER — DEXTROSE 5 % IV SOLN
2.0000 g | INTRAVENOUS | Status: DC
  Administered 2017-03-10 – 2017-03-11 (×2): 2 g via INTRAVENOUS
  Filled 2017-03-10 (×3): qty 2

## 2017-03-10 MED ORDER — MORPHINE SULFATE 15 MG PO TABS
7.5000 mg | ORAL_TABLET | ORAL | Status: DC | PRN
  Administered 2017-03-11 (×2): 7.5 mg via ORAL
  Filled 2017-03-10 (×2): qty 1

## 2017-03-10 MED ORDER — SACCHAROMYCES BOULARDII 250 MG PO CAPS
250.0000 mg | ORAL_CAPSULE | Freq: Two times a day (BID) | ORAL | Status: DC
  Administered 2017-03-11 – 2017-03-12 (×3): 250 mg via ORAL
  Filled 2017-03-10 (×3): qty 1

## 2017-03-10 MED ORDER — VANCOMYCIN HCL IN DEXTROSE 1-5 GM/200ML-% IV SOLN
1000.0000 mg | Freq: Once | INTRAVENOUS | Status: AC
  Administered 2017-03-10: 1000 mg via INTRAVENOUS
  Filled 2017-03-10: qty 200

## 2017-03-10 MED ORDER — ALBUTEROL SULFATE (2.5 MG/3ML) 0.083% IN NEBU: 2.5000 mg | INHALATION_SOLUTION | RESPIRATORY_TRACT | Status: DC | PRN

## 2017-03-10 MED ORDER — VENLAFAXINE HCL ER 37.5 MG PO CP24
37.5000 mg | ORAL_CAPSULE | Freq: Every day | ORAL | Status: DC
  Administered 2017-03-11 – 2017-03-12 (×2): 37.5 mg via ORAL
  Filled 2017-03-10 (×3): qty 1

## 2017-03-10 MED ORDER — ONDANSETRON HCL 4 MG/2ML IJ SOLN: 4.0000 mg | Freq: Four times a day (QID) | INTRAMUSCULAR | Status: DC | PRN

## 2017-03-10 MED ORDER — CALCIUM CARBONATE ANTACID 500 MG PO CHEW: 1.0000 | CHEWABLE_TABLET | ORAL | Status: DC | PRN

## 2017-03-10 MED ORDER — ACETAMINOPHEN 325 MG PO TABS: 650.0000 mg | ORAL_TABLET | Freq: Four times a day (QID) | ORAL | Status: DC | PRN

## 2017-03-10 MED ORDER — FERROUS SULFATE 325 (65 FE) MG PO TABS
325.0000 mg | ORAL_TABLET | Freq: Every day | ORAL | Status: DC
  Administered 2017-03-11 – 2017-03-12 (×2): 325 mg via ORAL
  Filled 2017-03-10 (×2): qty 1

## 2017-03-10 MED ORDER — ONDANSETRON HCL 4 MG PO TABS: 4.0000 mg | ORAL_TABLET | Freq: Four times a day (QID) | ORAL | Status: DC | PRN

## 2017-03-10 MED ORDER — SODIUM CHLORIDE 0.9 % IV SOLN
INTRAVENOUS | Status: DC
  Administered 2017-03-10 – 2017-03-11 (×2): via INTRAVENOUS

## 2017-03-10 MED ORDER — ENOXAPARIN SODIUM 40 MG/0.4ML ~~LOC~~ SOLN
40.0000 mg | Freq: Every day | SUBCUTANEOUS | Status: DC
  Administered 2017-03-11 – 2017-03-12 (×2): 40 mg via SUBCUTANEOUS
  Filled 2017-03-10 (×2): qty 0.4

## 2017-03-10 MED ORDER — MIRTAZAPINE 15 MG PO TABS
15.0000 mg | ORAL_TABLET | Freq: Every day | ORAL | Status: DC
  Administered 2017-03-11 (×2): 15 mg via ORAL
  Filled 2017-03-10 (×2): qty 1
  Filled 2017-03-10: qty 0.5

## 2017-03-10 MED ORDER — VANCOMYCIN HCL IN DEXTROSE 750-5 MG/150ML-% IV SOLN
750.0000 mg | Freq: Two times a day (BID) | INTRAVENOUS | Status: DC
  Administered 2017-03-11 – 2017-03-12 (×3): 750 mg via INTRAVENOUS
  Filled 2017-03-10 (×5): qty 150

## 2017-03-10 MED ORDER — METRONIDAZOLE IN NACL 5-0.79 MG/ML-% IV SOLN
500.0000 mg | Freq: Three times a day (TID) | INTRAVENOUS | Status: DC
  Administered 2017-03-11 – 2017-03-12 (×6): 500 mg via INTRAVENOUS
  Filled 2017-03-10 (×9): qty 100

## 2017-03-10 MED ORDER — INSULIN GLARGINE 100 UNIT/ML ~~LOC~~ SOLN
10.0000 [IU] | Freq: Every day | SUBCUTANEOUS | Status: DC
  Administered 2017-03-11 (×2): 10 [IU] via SUBCUTANEOUS
  Filled 2017-03-10 (×3): qty 0.1

## 2017-03-10 MED ORDER — METHYLPHENIDATE HCL 5 MG PO TABS
5.0000 mg | ORAL_TABLET | Freq: Every day | ORAL | Status: DC
  Administered 2017-03-11 – 2017-03-12 (×2): 5 mg via ORAL
  Filled 2017-03-10 (×2): qty 1

## 2017-03-10 MED ORDER — INSULIN ASPART 100 UNIT/ML ~~LOC~~ SOLN
0.0000 [IU] | Freq: Three times a day (TID) | SUBCUTANEOUS | Status: DC
  Administered 2017-03-11 – 2017-03-12 (×3): 2 [IU] via SUBCUTANEOUS

## 2017-03-10 MED ORDER — ADULT MULTIVITAMIN W/MINERALS CH
1.0000 | ORAL_TABLET | Freq: Every day | ORAL | Status: DC
  Administered 2017-03-11 – 2017-03-12 (×2): 1 via ORAL
  Filled 2017-03-10 (×2): qty 1

## 2017-03-10 NOTE — ED Notes (Signed)
[facility] picking up patient to take [facility]. Paper work verified.

## 2017-03-10 NOTE — ED Notes (Signed)
Bed: OP92 Expected date:  Expected time:  Means of arrival:  Comments: EMS foot pain

## 2017-03-10 NOTE — H&P (Signed)
History and Physical    Brenda Moore JEH:[id number] DOB: [birth year] DOA: 03/10/2017  Referring MD/NP/PA: Caryl Ada, PA-C PCP: Patient, No Pcp Per  Patient coming from: [facility] via EMS  Chief Complaint: foot wound  HPI: Brenda Moore is a 61 y.o. female with medical history significant of diabetes mellitus type 2, osteomyelitis of the right foot, and depression who presents with worsening right foot wound.  Patient was hospitalized from 5/19 through 5/25 at [facility] after being diagnosed with osteomyelitis of the right foot with necrotic gangrene. At that time patient was evaluated by orthopedics and it was recommended that the patient undergo amputation; however patient refused and was deemed to have decision-making capacity after being formally evaluated by psychiatry. Blood cultures at that time were positive for a Staphylococcus species that was coag negative. Patient was placed on empiric antibiotics of vancomycin, cefepime, and metronidazole, but was sent to [facility] placed to complete a four-week course of doxycycline, ciprofloxacin, and Flagyl per ID. At the nursing facility they have been providing wound care and the patient states that the wound had been improving. She denies having any discharge or worsening of symptoms. She complains of a dull pain that's been constant around her ankle for which she takes morphine at night with relief. Was seen by the nursing facility physician and wound nurse who saw maggots coming from the wound. She was sent in for further evaluation and treatment. Patient denies well to have surgery on the foot still even though this could mean death. She is okay with having the wound cleaned out if needed. Patient reports blood sugars have been well controlled with blood sugars reportedly no higher than 120.  ED Course: Upon admission into the emergency department patient was seen to be afebrile with on his arms relatively within normal limits. Labs reveal WBC 9.6,  platelets 448, BUN 27, and creatinine 0.84. No imaging studies were performed, but blood cultures were obtained.  Review of Systems: Review of Systems  Constitutional: Negative for chills and fever.  HENT: Negative for congestion and ear discharge.   Eyes: Negative for pain and discharge.  Respiratory: Negative for cough and sputum production.   Cardiovascular: Positive for leg swelling. Negative for chest pain and palpitations.  Gastrointestinal: Negative for abdominal pain, nausea and vomiting.  Genitourinary: Negative for frequency and urgency.  Musculoskeletal: Positive for joint pain.  Skin: Negative for itching.       Right foot wound  Neurological: Negative for speech change and focal weakness.  Endo/Heme/Allergies: Negative for environmental allergies. Does not bruise/bleed easily.  Psychiatric/Behavioral: Positive for depression. Negative for suicidal ideas.    Past Medical History:  Diagnosis Date  . Anemia   . Diabetes mellitus without complication ([place])   . Osteomyelitis of right foot [facility])     Past Surgical History:  Procedure Laterality Date  . SKIN GRAFT Left    age 8 , skin graft to left leg due to lawn mower accident  . toe amptuation Left    age 74 - left little toe amputated due to lawn mower accident     reports that she quit smoking about 2 years ago. She has a 30.00 pack-year smoking history. She has never used smokeless tobacco. She reports that she does not drink alcohol or use drugs.  Allergies  Allergen Reactions  . Poison Oak Extract Rash    Family History  Problem Relation Age of Onset  . Lymphoma Mother   . Heart disease Mother   .  Prostate cancer Father   . Skin cancer Father   . Stroke Father   . Seizures Father   . Epilepsy Brother   . Prostate cancer Brother     Prior to Admission medications   Medication Sig Start Date End Date Taking? Authorizing Provider  acetaminophen (TYLENOL) 325 MG tablet Take 2 tablets (650 mg total) by  mouth every 6 (six) hours as needed for mild pain (or Fever >/= 101). 12/16/16  Yes Elgergawy, Silver Huguenin, MD  calcium carbonate (TUMS - DOSED IN MG ELEMENTAL CALCIUM) 500 MG chewable tablet Chew 1 tablet by mouth every 4 (four) hours as needed for indigestion or heartburn.   Yes [provider]  ferrous sulfate 325 (65 FE) MG tablet Take 325 mg by mouth daily with breakfast.   Yes [provider]  insulin aspart (NOVOLOG) 100 UNIT/ML injection Inject 0-5 Units into the skin at bedtime. Patient taking differently: Inject 0-5 Units into the skin at bedtime. At 2000 per sliding scale - if BS is <70 start hypoglycemic protocol, 70-149 = 0 units, 150-199 = 1 unit, 200-250 = 2 units, 251-299 = 3 units, 300-349 = 4 units, 350-400 = 5 units, >400 = 5 units and call MD/NP 12/16/16  Yes Elgergawy, Silver Huguenin, MD  insulin glargine (LANTUS) 100 UNIT/ML injection Inject 0.08 mLs (8 Units total) into the skin daily. Patient taking differently: Inject 10 Units into the skin daily.  12/17/16  Yes Elgergawy, Silver Huguenin, MD  methylphenidate (RITALIN) 5 MG tablet Take 5 mg by mouth daily.   Yes [provider]  mirtazapine (REMERON) 15 MG tablet Take 15 mg by mouth at bedtime.   Yes [provider]  morphine (MSIR) 15 MG tablet Take 0.5 tablets (7.5 mg total) by mouth every 4 (four) hours as needed for severe pain. 12/16/16  Yes Elgergawy, Silver Huguenin, MD  Multiple Vitamins-Minerals (MULTIVITAMIN WITH MINERALS) tablet Take 1 tablet by mouth daily.   Yes [provider]  saccharomyces boulardii (FLORASTOR) 250 MG capsule Take 1 capsule (250 mg total) by mouth 2 (two) times daily. 12/16/16  Yes Elgergawy, Silver Huguenin, MD  venlafaxine (EFFEXOR) 37.5 MG tablet Take 37.5 mg by mouth daily.   Yes [provider]  ciprofloxacin (CIPRO) 500 MG tablet Take 1 tablet (500 mg total) by mouth 2 (two) times daily. Continue for 4 weeks Patient not taking: Reported on 03/10/2017 12/16/16   Elgergawy,  Silver Huguenin, MD  doxycycline (VIBRA-TABS) 100 MG tablet Take 1 tablet (100 mg total) by mouth every 12 (twelve) hours. Continue for 4 weeks Patient not taking: Reported on 03/10/2017 12/16/16   Elgergawy, Silver Huguenin, MD  insulin aspart (NOVOLOG) 100 UNIT/ML injection Inject 0-9 Units into the skin 3 (three) times daily with meals. Patient not taking: Reported on 03/10/2017 12/16/16   Elgergawy, Silver Huguenin, MD  metroNIDAZOLE (FLAGYL) 500 MG tablet Take 1 tablet (500 mg total) by mouth every 8 (eight) hours. Please take for 4 weeks Patient not taking: Reported on 03/10/2017 12/16/16   Elgergawy, Silver Huguenin, MD    Physical Exam:  Constitutional: older female who appears to be in NAD, but chronically ill. Vitals:   03/10/17 1509 03/10/17 1617 03/10/17 1910  BP: (!) 157/82 (!) 146/73 (!) 145/84  Pulse: 82 75 72  Resp: 18 18 18   Temp: 97.8 F (36.6 C) 98.5 F (36.9 C)   TempSrc: Oral Oral   SpO2: 96% 96% 95%   Eyes: PERRL, lids and conjunctivae normal ENMT: Mucous membranes are  dry. Posterior pharynx clear of any exudate or lesions.Normal dentition.  Neck: normal, supple, no masses, no thyromegaly Respiratory: clear to auscultation bilaterally, no wheezing, no crackles. Normal respiratory effort. No accessory muscle use.  Cardiovascular: Regular rate and rhythm, no murmurs / rubs / gallops. No extremity edema. 2+ pedal pulses. No carotid bruits.  Abdomen: no tenderness, no masses palpated. No hepatosplenomegaly. Bowel sounds positive.  Musculoskeletal: no clubbing / cyanosis. Fifth digit of left foot amputated  Skin: Necrotic appearance of the right foot with swelling in erythema on the dorsal aspect of the foot with drainage and foul odor present. 1 inch ulceration on the lateral aspect of left first toe present. Neurologic: CN 2-12 grossly intact. Sensation Abnormal, DTR normal. Strength 5/5 in all 4.  Psychiatric: Normal judgment and insight. Alert and oriented x 3.  Flat affect.     Labs on  Admission: I have personally reviewed following labs and imaging studies  CBC:  Recent Labs Lab 03/10/17 1805  WBC 9.6  NEUTROABS 5.2  HGB 13.5  HCT 40.2  MCV 86.3  PLT 570*   Basic Metabolic Panel:  Recent Labs Lab 03/10/17 1805  NA 138  K 4.1  CL 102  CO2 27  GLUCOSE 121*  BUN 27*  CREATININE 0.84  CALCIUM 9.6   GFR: CrCl cannot be calculated (Unknown ideal weight.). Liver Function Tests:  Recent Labs Lab 03/10/17 1805  AST 19  ALT 12*  ALKPHOS 84  BILITOT 0.7  PROT 8.2*  ALBUMIN 3.5   No results for input(s): LIPASE, AMYLASE in the last 168 hours. No results for input(s): AMMONIA in the last 168 hours. Coagulation Profile: No results for input(s): INR, PROTIME in the last 168 hours. Cardiac Enzymes: No results for input(s): CKTOTAL, CKMB, CKMBINDEX, TROPONINI in the last 168 hours. BNP (last 3 results) No results for input(s): PROBNP in the last 8760 hours. HbA1C: No results for input(s): HGBA1C in the last 72 hours. CBG: No results for input(s): GLUCAP in the last 168 hours. Lipid Profile: No results for input(s): CHOL, HDL, LDLCALC, TRIG, CHOLHDL, LDLDIRECT in the last 72 hours. Thyroid Function Tests: No results for input(s): TSH, T4TOTAL, FREET4, T3FREE, THYROIDAB in the last 72 hours. Anemia Panel: No results for input(s): VITAMINB12, FOLATE, FERRITIN, TIBC, IRON, RETICCTPCT in the last 72 hours. Urine analysis:    Component Value Date/Time   COLORURINE YELLOW 12/11/2016 1321   APPEARANCEUR CLOUDY (A) 12/11/2016 1321   LABSPEC 1.011 12/11/2016 1321   PHURINE 5.0 12/11/2016 1321   GLUCOSEU NEGATIVE 12/11/2016 1321   HGBUR LARGE (A) 12/11/2016 1321   BILIRUBINUR NEGATIVE 12/11/2016 1321   KETONESUR NEGATIVE 12/11/2016 1321   PROTEINUR 30 (A) 12/11/2016 1321   NITRITE NEGATIVE 12/11/2016 1321   LEUKOCYTESUR MODERATE (A) 12/11/2016 1321   Sepsis Labs: No results found for this or any previous visit (from the past 240 hour(s)).    Radiological Exams on Admission: No results found.  X-rays of right foot independently reviewed showing significant signs of of bone direction suggestive of osteomyelitis.  Assessment/Plan Gangrene and osteomyelitis of the right foot: Acute on chronic. Patient presents worsening right foot wound with maggots present. Previously seen and 11/2016 recommended to have amputation at that time, but patient still currently refusing amputation. - Admit to MedSurg bed - Foot wound infection order set initiated - Added on ESR and CRP  - Follow up blood cultures - Empiric antibiotics of Rocephin, metronidazole, and vancomycin given history of positive staph blood cultures  - Consult  called to [facility] for Dr. Sharol Given.  Physician on call recommended transfer to [facility] and Dr. Sharol Given will be notified regarding patient in a.m.  Diabetic ulcer of the left foot: Ulcer also present on the left first toe - Check x-ray of the left foot  Diabetes mellitus type 2, Uncontrolled: Last hemoglobin A1c 14.2 - Hypoglycemic protocols - Check hemoglobin A1c in am - Continue Lantus 10 units every - CBGs every before meals and at bedtime with sensitive SSI  Dehydration: Acute. Patient presents with  BUN 27 to creatinine 0.84. The elevated ratio greater than 20 suggests dehydration. - Gentle IV fluids NS at 37m/hr  overnight  Depression - Continue Effexor and Ritalin  DVT prophylaxis: Lovenox   Code Status: DNR  Family Communication: No family present at bedside Disposition Plan: Discharge back to rehabilitation facility once medically stable Consults called: [facility] Admission status: inpatient  RNorval MortonMD [facility] Pager [phone]  If 7PM-7AM, please contact night-coverage [email or web address] Password [email or web address]  03/10/2017, 8:02 PM

## 2017-03-10 NOTE — Progress Notes (Signed)
Pharmacy Antibiotic Note  Brenda Moore is a 61 y.o. female presented to the ED from [facility] on 03/10/17 for management of chronic ischemic right foot with maggots noted at site.  To start vancomycin, ceftriaxone and flagyl for infection.  Plan: - vancomycin 1000 mg IV x1, then 750 mg IV q12h - ceftriaxone 2gm IV q24h and flagyl 500 mg IV q8h per MD  ____________________________  Temp (24hrs), Avg:98.2 F (36.8 C), Min:97.8 F (36.6 C), Max:98.5 F (36.9 C)   Recent Labs Lab 03/10/17 1805  WBC 9.6  CREATININE 0.84    CrCl cannot be calculated (Unknown ideal weight.).    Allergies  Allergen Reactions  . Poison Oak Extract Rash     Thank you for allowing pharmacy to be a part of this patient's care.  Lynelle Doctor 03/10/2017 8:53 PM

## 2017-03-10 NOTE — ED Triage Notes (Signed)
Pt is a resident at [facility] for rehab. Pt has a chronic ischemic wound to right foot with reported maggots. Pt has been advised of amputation of right foot and refuses to have any measures taken to heal foot and preserve quality of life.

## 2017-03-10 NOTE — ED Provider Notes (Signed)
Medical screening examination/treatment/procedure(s) were conducted as a shared visit with non-physician practitioner(s) and myself.  I personally evaluated the patient during the encounter.   EKG Interpretation None     Patient has had chronic ischemic wound to the right foot. It has been progressively worse. It now has maggots infestation. On examination patient is alert and appropriate. Mental sinuses clear. No respiratory distress. Visual inspection of foot shows extensive necrotic, gangrenous changes with wet open wound associated. See attached image in PA-C note. I agree with plan of management.   Charlesetta Shanks, MD 03/10/17 [phone]

## 2017-03-10 NOTE — ED Provider Notes (Signed)
[facility] DEPT Provider Note   CSN: [id number] Arrival date & time: 03/10/17  1440     History   Chief Complaint Chief Complaint  Patient presents with  . Wound Infection    HPI Brenda Moore is a 61 y.o. female.  Pt resides at [facility].  Pt has had a foot infection developing for the past year.  Pt was admitted in May for osteo and advised to have an amputation.  Pt refused.  Pt has been receiving wound care.  Dr, Brain Hilts called and reports she sent pt here because they feel pt needs admission and more aggressive wound care.  She reports pt has maggots deep in tissue now.  The wound care specialist advised her to send pt here.  Pt is agreeable to wound care and antibiotics but does not want amputaion.     The history is provided by the patient. No language interpreter was used.  Foot Injury   Incident onset: months. The incident occurred at home. There was no injury mechanism. The pain is present in the right foot. The pain is moderate. The pain has been constant since onset. She reports no foreign bodies present. She has tried nothing for the symptoms. The treatment provided no relief.    Past Medical History:  Diagnosis Date  . Anemia   . Diabetes mellitus without complication ([place])   . Osteomyelitis of right foot [facility])     Patient Active Problem List   Diagnosis Date Noted  . Type 2 diabetes mellitus with diabetic neuropathy, unspecified ([place]) 12/22/2016  . Anemia, chronic disease 12/21/2016  . Advance care planning   . Palliative care by specialist   . Goals of care, counseling/discussion   . Protein-calorie malnutrition ([place])   . Chronic fatigue   . Bacteremia   . Adjustment disorder with depressed mood 12/12/2016  . Gangrene ([place]) 12/11/2016  . DKA (diabetic ketoacidoses) ([place]) 12/11/2016  . Osteomyelitis ([place]) 12/11/2016  . High anion gap metabolic acidosis 36/14/4315    No past surgical history on file.  OB History    No data  available       Home Medications    Prior to Admission medications   Medication Sig Start Date End Date Taking? Authorizing Provider  acetaminophen (TYLENOL) 325 MG tablet Take 2 tablets (650 mg total) by mouth every 6 (six) hours as needed for mild pain (or Fever >/= 101). 12/16/16   Elgergawy, Silver Huguenin, MD  Amino Acids-Protein Hydrolys (FEEDING SUPPLEMENT, PRO-STAT SUGAR FREE 64,) LIQD Take 30 mLs by mouth 3 (three) times daily.    [provider]  calcium carbonate (TUMS - DOSED IN MG ELEMENTAL CALCIUM) 500 MG chewable tablet Chew 1 tablet by mouth every 4 (four) hours as needed for indigestion or heartburn.    [provider]  ciprofloxacin (CIPRO) 500 MG tablet Take 1 tablet (500 mg total) by mouth 2 (two) times daily. Continue for 4 weeks 12/16/16   Elgergawy, Silver Huguenin, MD  doxycycline (VIBRA-TABS) 100 MG tablet Take 1 tablet (100 mg total) by mouth every 12 (twelve) hours. Continue for 4 weeks 12/16/16   Elgergawy, Silver Huguenin, MD  ferrous sulfate 325 (65 FE) MG tablet Take 325 mg by mouth daily with breakfast.    [provider]  insulin aspart (NOVOLOG) 100 UNIT/ML injection Inject 0-5 Units into the skin at bedtime. 12/16/16   Elgergawy, Silver Huguenin, MD  insulin aspart (NOVOLOG) 100 UNIT/ML injection Inject 0-9 Units into the  skin 3 (three) times daily with meals. 12/16/16   Elgergawy, Silver Huguenin, MD  insulin glargine (LANTUS) 100 UNIT/ML injection Inject 0.08 mLs (8 Units total) into the skin daily. 12/17/16   Elgergawy, Silver Huguenin, MD  methylphenidate (RITALIN) 5 MG tablet Take 5 mg by mouth daily.    [provider]  metroNIDAZOLE (FLAGYL) 500 MG tablet Take 1 tablet (500 mg total) by mouth every 8 (eight) hours. Please take for 4 weeks 12/16/16   Elgergawy, Silver Huguenin, MD  morphine (MSIR) 15 MG tablet Take 0.5 tablets (7.5 mg total) by mouth every 4 (four) hours as needed for severe pain. 12/16/16   Elgergawy, Silver Huguenin, MD  Multiple Vitamins-Minerals (MULTIVITAMIN  WITH MINERALS) tablet Take 1 tablet by mouth daily.    [provider]  saccharomyces boulardii (FLORASTOR) 250 MG capsule Take 1 capsule (250 mg total) by mouth 2 (two) times daily. 12/16/16   Elgergawy, Silver Huguenin, MD  UNABLE TO FIND Med Name: Med pass 120 mL by mouth 2 times daily    [provider]  venlafaxine (EFFEXOR) 37.5 MG tablet Take 37.5 mg by mouth daily.    [provider]    Family History Family History  Problem Relation Age of Onset  . Lymphoma Mother   . Heart disease Mother   . Prostate cancer Father   . Skin cancer Father   . Stroke Father   . Seizures Father   . Epilepsy Brother   . Prostate cancer Brother     Social History Social History  Substance Use Topics  . Smoking status: Former Smoker    Packs/day: 0.75    Years: 40.00  . Smokeless tobacco: Never Used  . Alcohol use No     Allergies   Poison oak extract   Review of Systems Review of Systems  All other systems reviewed and are negative.    Physical Exam Updated Vital Signs BP (!) 146/73 (BP Location: Right Arm)   Pulse 75   Temp 98.5 F (36.9 C) (Oral)   Resp 18   LMP  (LMP Unknown)   SpO2 96%   Physical Exam  Constitutional: She appears well-developed and well-nourished.  HENT:  Head: Normocephalic.  Cardiovascular: Normal rate.   Pulmonary/Chest: Effort normal.  Musculoskeletal: She exhibits tenderness and deformity.  Black necrotic toes,  Foul odor,  See images  Neurological: She is alert.  Skin: Skin is warm.  Psychiatric: She has a normal mood and affect.  Nursing note and vitals reviewed.    ED Treatments / Results  Labs (all labs ordered are listed, but only abnormal results are displayed) Labs Reviewed  CULTURE, BLOOD (ROUTINE X 2)  CULTURE, BLOOD (ROUTINE X 2)  CBC WITH DIFFERENTIAL/PLATELET  COMPREHENSIVE METABOLIC PANEL    EKG  EKG Interpretation None       Radiology No results found.  Procedures Procedures (including  critical care time)  Medications Ordered in ED Medications - No data to display   Initial Impression / Assessment and Plan / ED Course  I have reviewed the triage vital signs and the nursing notes.  Pertinent labs & imaging results that were available during my care of the patient were reviewed by me and considered in my medical decision making (see chart for details).     Dr. Vallery Ridge in to see.  I will consult hospitalist for admission.    Final Clinical Impressions(s) / ED Diagnoses   Final diagnoses:  Wound infection    New Prescriptions New  Prescriptions   No medications on file    I spoke to Hospitalist   Dr. Tamala Julian who will admit.   Fransico Meadow, PA-C 03/10/17 1836    Fransico Meadow, PA-C 03/10/17 2012    Charlesetta Shanks, MD 03/20/17 2133

## 2017-03-10 NOTE — ED Notes (Signed)
Report attempted at this time.

## 2017-03-10 NOTE — ED Notes (Signed)
Report attempted for the 2nd time.

## 2017-03-11 ENCOUNTER — Inpatient Hospital Stay (HOSPITAL_COMMUNITY): Payer: Medicaid Other

## 2017-03-11 DIAGNOSIS — I96 Gangrene, not elsewhere classified: Secondary | ICD-10-CM

## 2017-03-11 DIAGNOSIS — E11621 Type 2 diabetes mellitus with foot ulcer: Secondary | ICD-10-CM | POA: Diagnosis present

## 2017-03-11 DIAGNOSIS — L97519 Non-pressure chronic ulcer of other part of right foot with unspecified severity: Secondary | ICD-10-CM

## 2017-03-11 DIAGNOSIS — E860 Dehydration: Secondary | ICD-10-CM | POA: Diagnosis present

## 2017-03-11 LAB — CBC
HCT: 35.7 % — ABNORMAL LOW (ref 36.0–46.0)
HEMOGLOBIN: 11.6 g/dL — AB (ref 12.0–15.0)
MCH: 28 pg (ref 26.0–34.0)
MCHC: 32.5 g/dL (ref 30.0–36.0)
MCV: 86 fL (ref 78.0–100.0)
Platelets: 403 10*3/uL — ABNORMAL HIGH (ref 150–400)
RBC: 4.15 MIL/uL (ref 3.87–5.11)
RDW: 14.5 % (ref 11.5–15.5)
WBC: 10.1 10*3/uL (ref 4.0–10.5)

## 2017-03-11 LAB — BLOOD CULTURE ID PANEL (REFLEXED)
Acinetobacter baumannii: NOT DETECTED
CANDIDA ALBICANS: NOT DETECTED
CANDIDA GLABRATA: NOT DETECTED
CANDIDA PARAPSILOSIS: NOT DETECTED
Candida krusei: NOT DETECTED
Candida tropicalis: NOT DETECTED
Carbapenem resistance: NOT DETECTED
ENTEROBACTER CLOACAE COMPLEX: NOT DETECTED
ENTEROBACTERIACEAE SPECIES: NOT DETECTED
ENTEROCOCCUS SPECIES: NOT DETECTED
ESCHERICHIA COLI: NOT DETECTED
Haemophilus influenzae: NOT DETECTED
KLEBSIELLA PNEUMONIAE: NOT DETECTED
Klebsiella oxytoca: NOT DETECTED
Listeria monocytogenes: NOT DETECTED
METHICILLIN RESISTANCE: DETECTED — AB
Neisseria meningitidis: NOT DETECTED
PSEUDOMONAS AERUGINOSA: NOT DETECTED
Proteus species: NOT DETECTED
STAPHYLOCOCCUS AUREUS BCID: NOT DETECTED
STREPTOCOCCUS AGALACTIAE: NOT DETECTED
STREPTOCOCCUS PNEUMONIAE: NOT DETECTED
Serratia marcescens: NOT DETECTED
Staphylococcus species: DETECTED — AB
Streptococcus pyogenes: NOT DETECTED
Streptococcus species: NOT DETECTED
VANCOMYCIN RESISTANCE: NOT DETECTED

## 2017-03-11 LAB — BASIC METABOLIC PANEL
Anion gap: 8 (ref 5–15)
BUN: 21 mg/dL — AB (ref 6–20)
CALCIUM: 9.2 mg/dL (ref 8.9–10.3)
CHLORIDE: 102 mmol/L (ref 101–111)
CO2: 27 mmol/L (ref 22–32)
CREATININE: 0.9 mg/dL (ref 0.44–1.00)
GFR calc non Af Amer: >60 mL/min (ref >60)
Glucose, Bld: 106 mg/dL — ABNORMAL HIGH (ref 65–99)
POTASSIUM: 3.7 mmol/L (ref 3.5–5.1)
Sodium: 137 mmol/L (ref 135–145)

## 2017-03-11 LAB — GLUCOSE, CAPILLARY
GLUCOSE-CAPILLARY: 117 mg/dL — AB (ref 65–99)
GLUCOSE-CAPILLARY: 98 mg/dL (ref 65–99)
GLUCOSE-CAPILLARY: 149 mg/dL — AB (ref 65–99)
Glucose-Capillary: 178 mg/dL — ABNORMAL HIGH (ref 65–99)
Glucose-Capillary: 181 mg/dL — ABNORMAL HIGH (ref 65–99)

## 2017-03-11 LAB — PREALBUMIN: Prealbumin: 17.7 mg/dL — ABNORMAL LOW (ref 18–38)

## 2017-03-11 LAB — HEMOGLOBIN A1C
Hgb A1c MFr Bld: 7 % — ABNORMAL HIGH (ref 4.8–5.6)
Mean Plasma Glucose: 154.2 mg/dL

## 2017-03-11 MED ORDER — HYDRALAZINE HCL 20 MG/ML IJ SOLN: 10.0000 mg | INTRAMUSCULAR | Status: DC | PRN

## 2017-03-11 MED ORDER — DAKINS (1/4 STRENGTH) 0.125 % EX SOLN
Freq: Two times a day (BID) | CUTANEOUS | Status: DC
  Administered 2017-03-11 – 2017-03-12 (×2)
  Filled 2017-03-11: qty 473

## 2017-03-11 MED ORDER — PRO-STAT SUGAR FREE PO LIQD
30.0000 mL | Freq: Every day | ORAL | Status: DC
  Administered 2017-03-11 – 2017-03-12 (×2): 30 mL via ORAL
  Filled 2017-03-11: qty 30

## 2017-03-11 MED ORDER — GLUCERNA SHAKE PO LIQD
237.0000 mL | Freq: Every day | ORAL | Status: DC
  Administered 2017-03-11 – 2017-03-12 (×2): 237 mL via ORAL

## 2017-03-11 MED ORDER — SODIUM CHLORIDE 0.9 % IV SOLN: Freq: Once | INTRAVENOUS | Status: DC

## 2017-03-11 NOTE — Progress Notes (Signed)
PHARMACY - PHYSICIAN COMMUNICATION CRITICAL VALUE ALERT - BLOOD CULTURE IDENTIFICATION (BCID)  Results for orders placed or performed during the hospital encounter of 03/10/17  Blood Culture ID Panel (Reflexed) (Collected: 03/10/2017 10:16 PM)  Result Value Ref Range   Enterococcus species NOT DETECTED NOT DETECTED   Vancomycin resistance NOT DETECTED NOT DETECTED   Listeria monocytogenes NOT DETECTED NOT DETECTED   Staphylococcus species DETECTED (A) NOT DETECTED   Staphylococcus aureus NOT DETECTED NOT DETECTED   Methicillin resistance DETECTED (A) NOT DETECTED   Streptococcus species NOT DETECTED NOT DETECTED   Streptococcus agalactiae NOT DETECTED NOT DETECTED   Streptococcus pneumoniae NOT DETECTED NOT DETECTED   Streptococcus pyogenes NOT DETECTED NOT DETECTED   Acinetobacter baumannii NOT DETECTED NOT DETECTED   Enterobacteriaceae species NOT DETECTED NOT DETECTED   Enterobacter cloacae complex NOT DETECTED NOT DETECTED   Escherichia coli NOT DETECTED NOT DETECTED   Klebsiella oxytoca NOT DETECTED NOT DETECTED   Klebsiella pneumoniae NOT DETECTED NOT DETECTED   Proteus species NOT DETECTED NOT DETECTED   Serratia marcescens NOT DETECTED NOT DETECTED   Carbapenem resistance NOT DETECTED NOT DETECTED   Haemophilus influenzae NOT DETECTED NOT DETECTED   Neisseria meningitidis NOT DETECTED NOT DETECTED   Pseudomonas aeruginosa NOT DETECTED NOT DETECTED   Candida albicans NOT DETECTED NOT DETECTED   Candida glabrata NOT DETECTED NOT DETECTED   Candida krusei NOT DETECTED NOT DETECTED   Candida parapsilosis NOT DETECTED NOT DETECTED   Candida tropicalis NOT DETECTED NOT DETECTED    Name of physician (or Provider) Contacted:   NA  Changes to prescribed antibiotics required:   None needed, already receiving Vancomycin for osteomyelitis  Caryl Pina 03/11/2017  11:53 PM

## 2017-03-11 NOTE — Consult Note (Addendum)
[facility] Nurse wound consult note Reason for Consult: Maggot infestation of her right foot, full thickness   Wound type:neuropathic in origin, insensate Pressure Injury POA: NA Measurement: On right foot wound incompasses a 10cm area mostly between 2nd and 5th toe but with smaller area encircling the great toe and great toe nail. Wound extends from the dorsal portion of the foot to the plantar surface. Osteomyelitis has already been determined and patient has refused surgery.  Wound bed:100% necrotic with large infestation of maggots.  Drainage (amount, consistency, odor) extremely malodorous, moderate sanguinous drainage. Periwound:errythema and edema present to intact dry scaly skin. Dressing procedure/placement/frequency: Patient had to leave for radiology so I will return when [place] [place] [place] arrives and pt returns. I will remove as many maggots as possible.  I have provided nurses with orders for Cleanse Right foot wound with NS, pat dry, apply 1/4 strength Dakins Solution wet to dry gauze, cover with dry gauze, wrap with Kerlix, perform BID. After three days we will change to NS wet to dry dressings. We will not follow, but will remain available to this patient, to nursing, and the medical and/or surgical teams.  Please re-consult if we need to assist further.   Fara Olden, RN-C, WTA-C, [facility] Wound Treatment Associate

## 2017-03-11 NOTE — Consult Note (Addendum)
WOC follow-up: Refer to earlier [facility] note.  Performed conservative sharp debridement of loose eschar to right foot, using scissors and forcepts.  Pt tolerated without pain or bleeding. Several toes are loosely attached by strands of bone and tendon.  Opened a large pocket of eschar and attempted to remove as many maggots as possible with forceps and Dakins solution flushed in wounds. Large amt maggots removed and informed patient there may be more which hatch and surface soon which are located deeper inside the wound.  Extremely foul strong odor, large amt of eschar remains to foot, since it would require amputation to remove the entire affected area and patient has refused this therapy.  Moist Dakins dressing packed into and around the wounds and kerlex applied. Discussed plan of care and pt asked appropriate questions. Please re-consult if further assistance is needed.  Thank-you,  Julien Girt MSN, [place], [place], [place], [place]

## 2017-03-11 NOTE — Consult Note (Signed)
ORTHOPAEDIC CONSULTATION  REQUESTING PHYSICIAN: Velvet Bathe, MD  Chief Complaint: Maggots and gangrenous changes with foul-smelling odor right foot  HPI: Brenda Moore is a 61 y.o. female who presents with active maggot infestation of her right foot with diabetic insensate neuropathy.  Patient lives home alone.  Past Medical History:  Diagnosis Date  . Anemia   . Diabetes mellitus without complication ([place])   . Osteomyelitis of right foot [facility])    Past Surgical History:  Procedure Laterality Date  . SKIN GRAFT Left    age 54 , skin graft to left leg due to lawn mower accident  . toe amptuation Left    age 71 - left little toe amputated due to lawn mower accident   Social History   Social History  . Marital status: Divorced    Spouse name: N/A  . Number of children: N/A  . Years of education: N/A   Social History Main Topics  . Smoking status: Former Smoker    Packs/day: 0.75    Years: 40.00    Quit date: 03/11/2015  . Smokeless tobacco: Never Used  . Alcohol use No  . Drug use: No  . Sexual activity: Not Asked   Other Topics Concern  . None   Social History Narrative  . None   Family History  Problem Relation Age of Onset  . Lymphoma Mother   . Heart disease Mother   . Prostate cancer Father   . Skin cancer Father   . Stroke Father   . Seizures Father   . Epilepsy Brother   . Prostate cancer Brother    - negative except otherwise stated in the family history section Allergies  Allergen Reactions  . Poison Oak Extract Rash   Prior to Admission medications   Medication Sig Start Date End Date Taking? Authorizing Provider  acetaminophen (TYLENOL) 325 MG tablet Take 2 tablets (650 mg total) by mouth every 6 (six) hours as needed for mild pain (or Fever >/= 101). 12/16/16  Yes Elgergawy, Silver Huguenin, MD  calcium carbonate (TUMS - DOSED IN MG ELEMENTAL CALCIUM) 500 MG chewable tablet Chew 1 tablet by mouth every 4 (four) hours as needed for indigestion or  heartburn.   Yes [provider]  ferrous sulfate 325 (65 FE) MG tablet Take 325 mg by mouth daily with breakfast.   Yes [provider]  insulin aspart (NOVOLOG) 100 UNIT/ML injection Inject 0-5 Units into the skin at bedtime. Patient taking differently: Inject 0-5 Units into the skin at bedtime. At 2000 per sliding scale - if BS is <70 start hypoglycemic protocol, 70-149 = 0 units, 150-199 = 1 unit, 200-250 = 2 units, 251-299 = 3 units, 300-349 = 4 units, 350-400 = 5 units, >400 = 5 units and call MD/NP 12/16/16  Yes Elgergawy, Silver Huguenin, MD  insulin glargine (LANTUS) 100 UNIT/ML injection Inject 0.08 mLs (8 Units total) into the skin daily. Patient taking differently: Inject 10 Units into the skin daily.  12/17/16  Yes Elgergawy, Silver Huguenin, MD  methylphenidate (RITALIN) 5 MG tablet Take 5 mg by mouth daily.   Yes [provider]  mirtazapine (REMERON) 15 MG tablet Take 15 mg by mouth at bedtime.   Yes [provider]  morphine (MSIR) 15 MG tablet Take 0.5 tablets (7.5 mg total) by mouth every 4 (four) hours as needed for severe pain. 12/16/16  Yes Elgergawy, Silver Huguenin, MD  Multiple Vitamins-Minerals (MULTIVITAMIN WITH MINERALS) tablet Take 1 tablet by mouth  daily.   Yes [provider]  saccharomyces boulardii (FLORASTOR) 250 MG capsule Take 1 capsule (250 mg total) by mouth 2 (two) times daily. 12/16/16  Yes Elgergawy, Silver Huguenin, MD  venlafaxine (EFFEXOR) 37.5 MG tablet Take 37.5 mg by mouth daily.   Yes [provider]  ciprofloxacin (CIPRO) 500 MG tablet Take 1 tablet (500 mg total) by mouth 2 (two) times daily. Continue for 4 weeks Patient not taking: Reported on 03/10/2017 12/16/16   Elgergawy, Silver Huguenin, MD  doxycycline (VIBRA-TABS) 100 MG tablet Take 1 tablet (100 mg total) by mouth every 12 (twelve) hours. Continue for 4 weeks Patient not taking: Reported on 03/10/2017 12/16/16   Elgergawy, Silver Huguenin, MD  insulin aspart (NOVOLOG) 100 UNIT/ML  injection Inject 0-9 Units into the skin 3 (three) times daily with meals. Patient not taking: Reported on 03/10/2017 12/16/16   Elgergawy, Silver Huguenin, MD  metroNIDAZOLE (FLAGYL) 500 MG tablet Take 1 tablet (500 mg total) by mouth every 8 (eight) hours. Please take for 4 weeks Patient not taking: Reported on 03/10/2017 12/16/16   Elgergawy, Silver Huguenin, MD   Dg Foot 2 Views Right  Result Date: 03/10/2017 CLINICAL DATA:  61 y/o F; chronic ischemic wound of the right foot with maggots. EXAM: RIGHT FOOT - 2 VIEW COMPARISON:  None. FINDINGS: Large erosion within the lateral first metatarsal head, heads of the 2nd to 4th metatarsals, base of the second proximal phalanx, and diffuse deformity of the first proximal phalanx with mixed lytic and sclerotic changes compatible with acute and chronic osteomyelitis. Soft tissue swelling of the foot. Periosteal reaction is present along the diaphysis of the first through the fourth metatarsal bones indicating chronic inflammation.There also may be erosions of the medial aspect of the fifth proximal phalanx. IMPRESSION: Acute on chronic osteomyelitis involving the 1st to 4th digits with erosion of 1st to 4th metatarsal heads and the 1st to 2nd proximal phalanx. There also may be erosions of the medial aspect of the fifth proximal phalanx. Extensive soft tissue swelling. Electronically Signed   By: Kristine Garbe M.D.   On: 03/10/2017 22:08   - pertinent xrays, CT, MRI studies were reviewed and independently interpreted  Positive ROS: All other systems have been reviewed and were otherwise negative with the exception of those mentioned in the HPI and as above.  Physical Exam: General: Alert, no acute distress Psychiatric: Patient is competent for consent with normal mood and affect Lymphatic: No axillary or cervical lymphadenopathy Cardiovascular: No pedal edema Respiratory: No cyanosis, no use of accessory musculature GI: No organomegaly, abdomen is soft and  non-tender  Skin: Patient has necrotic skin of the right forefoot. There is active maggots infestation.   Neurologic: Patient does not have protective sensation bilateral lower extremities.   MUSCULOSKELETAL:  Patient has a foul-smelling odor of the right foot. The odor is present outside the room in the hallway with a door closed. Patient has active maggots infestation with necrotic forefoot.  Assessment: Assessment necrotic right forefoot with active maggots infestation with foul-smelling odor.  Plan: Plan: I had a long conversation with the patient stating that her only option would be a transtibial amputation. Patient states that she does not want to consider surgery. I discussed that this is Brenda Moore and that without proceeding with surgery this will surely lead to her demise. Patient states that she is not concerned and would rather die than have an amputation. Discussed the functional capabilities with a prosthesis again reinforced the medical recommendation  for amputation. Patient again refused. I recommended that she not eat today and she can talk with the nurses or her primary care physician if she wishes to proceed with surgery today.   Thank you for the consult and the opportunity to see Ms. Burns Spain, MD [place] [phone] 7:28 AM

## 2017-03-11 NOTE — Progress Notes (Signed)
PROGRESS NOTE    Emmalynn Pinkham  HQP:[id number] DOB: [birth year] DOA: 03/10/2017 PCP: Patient, No Pcp Per    Brief Narrative:  61 year old presenting with right gangrenous foot that has maggots and follows well. Was evaluated by orthopedic surgery who recommended amputation but patient refuses.   Assessment & Plan:   Principal Problem:   Gangrene of right foot ([place]) - Was seen and evaluated by wound care nurse as well as surgeon. Surgeon recommended amputation as cure. Patient refused despite the fact that it was explained that without treatment she would die. She is alert and oriented and understands consequences of not undergoing amputation. Patient would like to be transitioned to skilled nursing facility with hospice. Wound care nurse was going to evaluate wound and try to remove as many maggots is possible.  Active Problems:   Osteomyelitis ([place]) - Please see discussion above. For now continue antibiotics.    Type 2 diabetes mellitus with diabetic neuropathy, unspecified (HCC) - Continue current regimen.   DVT prophylaxis: Lovenox Code Status: DO NOT RESUSCITATE Family Communication: Discussed with patient directly no family at bedside Disposition Plan: Should patient not want operation will plan on discharging next a.m. we'll try to speak to patient again tomorrow regarding need for amputation   Consultants:   Orthopedic surgery   Procedures: None   Antimicrobials: Rocephin, Flagyl, vancomycin   Subjective: The patient does not wish for any amputation despite risks of dying  Objective: Vitals:   03/10/17 2308 03/10/17 2309 03/11/17 0034 03/11/17 1645  BP:  (!) 142/80 137/80 135/69  Pulse:  81 79 83  Resp:  17 17 16   Temp:  98.5 F (36.9 C) 98.3 F (36.8 C) 98.8 F (37.1 C)  TempSrc:  Oral Oral Oral  SpO2:  94% 96% 92%  Weight: 66.2 kg (146 lb)     Height: 5\' 5"  (1.651 m)       Intake/Output Summary (Last 24 hours) at 03/11/17 1846 Last data filed at  03/11/17 1600  Gross per 24 hour  Intake          2206.25 ml  Output                0 ml  Net          2206.25 ml   Filed Weights   03/10/17 2117 03/10/17 2308  Weight: 66.2 kg (146 lb) 66.2 kg (146 lb)    Examination:  General exam: Appears calm and comfortable  Respiratory system: Clear to auscultation. Respiratory effort normal. Cardiovascular system: S1 & S2 heard, RRR. No JVD, murmurs, rubs, gallops or clicks. No pedal edema. Gastrointestinal system: Abdomen is nondistended, soft and nontender. No organomegaly or masses felt. Normal bowel sounds heard. Central nervous system: Alert and oriented. No focal neurological deficits. Extremities: gangrene of right foot, foul smell Skin: has foul smelling right foot with guaze covering. Psychiatry: Judgement and insight appear normal. Mood & affect appropriate.     Data Reviewed: I have personally reviewed following labs and imaging studies  CBC:  Recent Labs Lab 03/10/17 1805 03/11/17 0342  WBC 9.6 10.1  NEUTROABS 5.2  --   HGB 13.5 11.6*  HCT 40.2 35.7*  MCV 86.3 86.0  PLT 448* 599*   Basic Metabolic Panel:  Recent Labs Lab 03/10/17 1805 03/11/17 0342  NA 138 137  K 4.1 3.7  CL 102 102  CO2 27 27  GLUCOSE 121* 106*  BUN 27* 21*  CREATININE 0.84 0.90  CALCIUM 9.6 9.2   GFR:  Estimated Creatinine Clearance: 59.8 mL/min (by C-G formula based on SCr of 0.9 mg/dL). Liver Function Tests:  Recent Labs Lab 03/10/17 1805  AST 19  ALT 12*  ALKPHOS 84  BILITOT 0.7  PROT 8.2*  ALBUMIN 3.5   No results for input(s): LIPASE, AMYLASE in the last 168 hours. No results for input(s): AMMONIA in the last 168 hours. Coagulation Profile: No results for input(s): INR, PROTIME in the last 168 hours. Cardiac Enzymes: No results for input(s): CKTOTAL, CKMB, CKMBINDEX, TROPONINI in the last 168 hours. BNP (last 3 results) No results for input(s): PROBNP in the last 8760 hours. HbA1C:  Recent Labs  03/11/17 0342    HGBA1C 7.0*   CBG:  Recent Labs Lab 03/11/17 0106 03/11/17 0703 03/11/17 1147 03/11/17 1644  GLUCAP 117* 98 149* 178*   Lipid Profile: No results for input(s): CHOL, HDL, LDLCALC, TRIG, CHOLHDL, LDLDIRECT in the last 72 hours. Thyroid Function Tests: No results for input(s): TSH, T4TOTAL, FREET4, T3FREE, THYROIDAB in the last 72 hours. Anemia Panel: No results for input(s): VITAMINB12, FOLATE, FERRITIN, TIBC, IRON, RETICCTPCT in the last 72 hours. Sepsis Labs: No results for input(s): PROCALCITON, LATICACIDVEN in the last 168 hours.  Recent Results (from the past 240 hour(s))  Blood culture (routine x 2)     Status: None (Preliminary result)   Collection Time: 03/10/17  6:05 PM  Result Value Ref Range Status   Specimen Description BLOOD RIGHT ANTECUBITAL  Final   Special Requests   Final    BOTTLES DRAWN AEROBIC AND ANAEROBIC Blood Culture adequate volume   Culture   Final    NO GROWTH < 24 HOURS Performed at [facility], [address] [place], [place] [address]    Report Status PENDING  Incomplete  Blood culture (routine x 2)     Status: None (Preliminary result)   Collection Time: 03/10/17 10:16 PM  Result Value Ref Range Status   Specimen Description BLOOD LEFT ANTECUBITAL  Final   Special Requests   Final    BOTTLES DRAWN AEROBIC AND ANAEROBIC Blood Culture adequate volume   Culture   Final    NO GROWTH < 24 HOURS Performed at [facility], [place] [address] [place], [place] [address]    Report Status PENDING  Incomplete         Radiology Studies: Dg Foot 2 Views Left  Result Date: 03/11/2017 CLINICAL DATA:  Diabetic foot ulcer EXAM: LEFT FOOT - 2 VIEW COMPARISON:  None. FINDINGS: Amputation of the fifth toe at the level of the distal metatarsal. No evidence of osteomyelitis in the fifth metatarsal Wound along the medial surface of the first interphalangeal joint with debris in the wound. Negative for osteomyelitis. Negative for fracture  IMPRESSION: Negative for osteomyelitis. Electronically Signed   By: Franchot Gallo M.D.   On: 03/11/2017 09:04   Dg Foot 2 Views Right  Result Date: 03/10/2017 CLINICAL DATA:  61 y/o F; chronic ischemic wound of the right foot with maggots. EXAM: RIGHT FOOT - 2 VIEW COMPARISON:  None. FINDINGS: Large erosion within the lateral first metatarsal head, heads of the 2nd to 4th metatarsals, base of the second proximal phalanx, and diffuse deformity of the first proximal phalanx with mixed lytic and sclerotic changes compatible with acute and chronic osteomyelitis. Soft tissue swelling of the foot. Periosteal reaction is present along the diaphysis of the first through the fourth metatarsal bones indicating chronic inflammation.There also may be erosions of the medial aspect of the  fifth proximal phalanx. IMPRESSION: Acute on chronic osteomyelitis involving the 1st to 4th digits with erosion of 1st to 4th metatarsal heads and the 1st to 2nd proximal phalanx. There also may be erosions of the medial aspect of the fifth proximal phalanx. Extensive soft tissue swelling. Electronically Signed   By: Kristine Garbe M.D.   On: 03/10/2017 22:08        Scheduled Meds: . enoxaparin (LOVENOX) injection  40 mg Subcutaneous Daily  . feeding supplement (GLUCERNA SHAKE)  237 mL Oral Q1200  . feeding supplement (PRO-STAT SUGAR FREE 64)  30 mL Oral Q1500  . ferrous sulfate  325 mg Oral Q breakfast  . insulin aspart  0-9 Units Subcutaneous TID WC  . insulin glargine  10 Units Subcutaneous QHS  . methylphenidate  5 mg Oral Q breakfast  . mirtazapine  15 mg Oral QHS  . multivitamin with minerals  1 tablet Oral Daily  . saccharomyces boulardii  250 mg Oral BID  . sodium chloride flush  3 mL Intravenous Q12H  . sodium hypochlorite   Irrigation BID  . venlafaxine XR  37.5 mg Oral Q breakfast   Continuous Infusions: . sodium chloride 75 mL/hr at 03/11/17 1750  . cefTRIAXone (ROCEPHIN)  IV Stopped (03/10/17  2304)   And  . metronidazole Stopped (03/11/17 1600)  . vancomycin Stopped (03/11/17 1458)     LOS: 1 day    Time spent: 25 min    Velvet Bathe, MD [facility] Pager [phone]  If 7PM-7AM, please contact night-coverage [email or web address] Password [facility] 03/11/2017, 6:46 PM

## 2017-03-11 NOTE — Progress Notes (Signed)
Initial Nutrition Assessment  DOCUMENTATION CODES:   Not applicable  INTERVENTION:  Provide Glucerna Shake po once daily, each supplement provides 220 kcal and 10 grams of protein.  Provide 30 ml Prostat po once daily, each supplement provides 100 kcal and 15 grams of protein.   Encourage adequate PO intake.   NUTRITION DIAGNOSIS:   Increased nutrient needs related to wound healing as evidenced by estimated needs.  GOAL:   Patient will meet greater than or equal to 90% of their needs  MONITOR:   PO intake, Supplement acceptance, Labs, Weight trends, Skin, I & O's  REASON FOR ASSESSMENT:   Malnutrition Screening Tool    ASSESSMENT:   61 y.o. female with medical history significant of diabetes mellitus type 2, osteomyelitis of the right foot, and depression who presents with worsening right foot wound. Pt with maggots and gangrenous changes with foul-smelling odor right foot  MD recommends transtibial amputation, however pt refuses surgery.  Meal completion has been 100%. Weight has been stable. RD to order nutritional supplements to aid in wound healing. Unable to complete Nutrition-Focused physical exam at this time. Labs and medications reviewed.   Diet Order:  Diet Carb Modified Fluid consistency: Thin; Room service appropriate? Yes  Skin:  Wound (see comment) (Gangrene R foot)  Last BM:  8/17  Height:   Ht Readings from Last 1 Encounters:  03/10/17 5\' 5"  (1.651 m)    Weight:   Wt Readings from Last 1 Encounters:  03/10/17 146 lb (66.2 kg)    Ideal Body Weight:  56.8 kg  BMI:  Body mass index is 24.3 kg/m.  Estimated Nutritional Needs:   Kcal:  1900-2100  Protein:  90-100 grams  Fluid:  1.9 - 2.1 L/day  EDUCATION NEEDS:   No education needs identified at this time  Corrin Parker, MS, RD, LDN Pager # [phone] After hours/ weekend pager # [phone]

## 2017-03-12 LAB — CREATININE, SERUM
Creatinine, Ser: 0.86 mg/dL (ref 0.44–1.00)
GFR calc Af Amer: >60 mL/min (ref >60)
GFR calc non Af Amer: >60 mL/min (ref >60)

## 2017-03-12 LAB — GLUCOSE, CAPILLARY
Glucose-Capillary: 199 mg/dL — ABNORMAL HIGH (ref 65–99)
Glucose-Capillary: 176 mg/dL — ABNORMAL HIGH (ref 65–99)
Glucose-Capillary: 122 mg/dL — ABNORMAL HIGH (ref 65–99)

## 2017-03-12 MED ORDER — AMOXICILLIN-POT CLAVULANATE 875-125 MG PO TABS: 1.0000 | ORAL_TABLET | Freq: Two times a day (BID) | ORAL | 0 refills | Status: AC

## 2017-03-12 MED ORDER — MORPHINE SULFATE 15 MG PO TABS: 7.5000 mg | ORAL_TABLET | ORAL | 0 refills | Status: DC | PRN

## 2017-03-12 NOTE — Consult Note (Signed)
[place] Nurse wound follow up Evaluated patient wound again today.  Scant amount of maggots are irrigated today.  Wound care is performed.  Patient asks how the maggots got there.  Discussed flies and the mechanism in which they spread.  She states she visited family recently and was outside a lot. Discussed surgery at length with patient.  That the recommended surgery would not limit her mobility with a prosthesis in place and she says she has thought a lot about it and does not desire surgery, even if she succumbs to infection without it.  Foot is cleansed, Dakins moist gauze is applied and foot secure with kerilx and tape.  Will not follow at this time.  Please re-consult if needed.  Domenic Moras RN BSN [place] Pager [phone]

## 2017-03-12 NOTE — Clinical Social Work Note (Signed)
Clinical Social Worker facilitated patient discharge including contacting patient family and facility to confirm patient discharge plans.  Clinical information faxed to facility and family agreeable with plan.  CSW arranged ambulance transport via [facility] to [facility]. RN to call report prior to discharge.  Clinical Social Worker will sign off for now as social work intervention is no longer needed. Please consult [place] again if new need arises.  Jamesetta Greenhalgh B. Joline Maxcy Clinical Social Work Dept Weekend Social Worker [phone] 5:10 PM

## 2017-03-12 NOTE — Progress Notes (Signed)
Patient alert and oriented x4 and ready to discharge to [facility] via [place].  All discharge instructions and rx reviewed. Personal belongings with pt. Rt foot dressing changed by wound care nurse prior to discharge.

## 2017-03-12 NOTE — Clinical Social Work Placement (Signed)
   CLINICAL SOCIAL WORK PLACEMENT  NOTE  Date:  03/12/2017  Patient Details  Name: Brenda Moore MRN: [id number] Date of Birth: 06/06/1956  Clinical Social Work is seeking post-discharge placement for this patient at the [place] level of care (*CSW will initial, date and re-position this form in  chart as items are completed):  Yes   Patient/family provided with [facility] Work Department's list of facilities offering this level of care within the geographic area requested by the patient (or if unable, by the patient's family).  Yes   Patient/family informed of their freedom to choose among providers that offer the needed level of care, that participate in Medicare, Medicaid or managed care program needed by the patient, have an available bed and are willing to accept the patient.      Patient/family informed of [facility] ownership interest in [facility] and [facility], as well as of the fact that they are under no obligation to receive care at these facilities.  PASRR submitted to EDS on       PASRR number received on       Existing PASRR number confirmed on       FL2 transmitted to all facilities in geographic area requested by pt/family on       FL2 transmitted to all facilities within larger geographic area on       Patient informed that his/her managed care company has contracts with or will negotiate with certain facilities, including the following:        Yes   Patient/family informed of bed offers received.  Patient chooses bed at [facility]     Physician recommends and patient chooses bed at      Patient to be transferred to [facility] on 03/12/17.  Patient to be transferred to facility by [facility]     Patient family notified on 03/12/17 of transfer.  Name of family member notified:  Patient reports she will notify the family     PHYSICIAN Please prepare priority discharge summary, including medications     Additional  Comment:    _______________________________________________ Raymondo Band, LCSWA 03/12/2017, 4:34 PM

## 2017-03-12 NOTE — Progress Notes (Signed)
CSW spoke with patient regarding plans for discharge. Patient reports she would like to return back to [facility]. Patient reports she is from this facility and has no concerns returning back. Patient also informed CSW that she does not want her family informed of diagnosis, and that she will have the conversation with them herself. No other needs were reported by the patient. Patient aware that CSW will contact facility for discharge.  CSW contacted Alyse Low at [facility] [phone] regarding patient. Patient is able to return back to the facility on today. CSW will arrange transportation once updated by MD.   CSW contacted Linus Orn with [facility] for hospice to follow patient at facility. Per Linus Orn, she will get referral for hospice sent out on tomorrow or early Monday morning. No other concerns to report.   CSW will continue to follow.   Lucius Conn, [place] Ph: [phone]

## 2017-03-12 NOTE — Discharge Summary (Signed)
Physician Discharge Summary  Brenda Moore WUJ:[id number] DOB: 1956/03/02 DOA: 03/10/2017  PCP: Patient, No Pcp Per  Admit date: 03/10/2017 Discharge date: 03/12/2017  Time spent: > 35 minutes  Recommendations for Outpatient Follow-up:  1. Please ensure comfort care measures at SNF. Consider continuing to talk to patient regarding obtaining life saving procedure. 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 Evaluated by orthopedic surgeon recommended amputation. Patient refused  Hospital Course:  Right foot gangrene - Patient had multiple physicians as well as wound care nurse explained importance of treatment. Patient understood that if she did not get her foot amputated she would expire. Patient refused amputation and would like to transition to SNF with hospice services. - I again today confirmed with patient whether she wanted to get lifesaving procedure and patient refused. - We'll continue antibiotic regimen with metronidazole and Augmentin, although frankly may be discontinued given the futility of treatment given advanced gangrene and osteomyelitis - had foot tended to by wound care nurse x 2 days. Had some maggots removed.  Her other known medical conditions we'll continue prior to admission medication regimen.   Procedures:  None patient refused despite discussion by orthopaedic surgery    Consultations:  Orthopaedic surgery: Dr. Sharol Given  Discharge Exam: Vitals:   03/12/17 0436 03/12/17 1442  BP: (!) 152/73 139/66  Pulse: 76 80  Resp: 16 18  Temp: (!) 97.5 F (36.4 C) 99.1 F (37.3 C)  SpO2: 93% 95%    General: Pt in nad, alert and awake Cardiovascular: s1 and s2 present. Respiratory: no increased wob, no wheezes  Discharge Instructions   Discharge Instructions    Call MD for:  redness, tenderness, or signs of infection (pain, swelling, redness, odor or green/yellow discharge around incision site)    Complete by:  As directed    Call MD for:  severe uncontrolled pain    Complete by:  As directed    Diet - low sodium heart healthy    Complete by:  As directed    Increase activity slowly    Complete by:  As directed      Current Discharge Medication List    START taking these medications   Details  amoxicillin-clavulanate (AUGMENTIN) 875-125 MG tablet Take 1 tablet by mouth 2 (two) times daily. Qty: 20 tablet, Refills: 0      CONTINUE these medications which have CHANGED   Details  morphine (MSIR) 15 MG tablet Take 0.5 tablets (7.5 mg total) by mouth every 4 (four) hours as needed for severe pain. Qty: 10 tablet, Refills: 0      CONTINUE these medications which have NOT CHANGED   Details  acetaminophen (TYLENOL) 325 MG tablet Take 2 tablets (650 mg total) by mouth every 6 (six) hours as needed for mild pain (or Fever >/= 101).    calcium carbonate (TUMS - DOSED IN MG ELEMENTAL CALCIUM) 500 MG chewable tablet Chew 1 tablet  by mouth every 4 (four) hours as needed for indigestion or heartburn.    ferrous sulfate 325 (65 FE) MG tablet Take 325 mg by mouth daily with breakfast.    !! insulin aspart (NOVOLOG) 100 UNIT/ML injection Inject 0-5 Units into the skin at bedtime. Qty: 10 mL, Refills: 11    insulin glargine (LANTUS) 100 UNIT/ML injection Inject 0.08 mLs (8 Units total) into the skin daily. Qty: 10 mL, Refills: 11    methylphenidate  (RITALIN) 5 MG tablet Take 5 mg by mouth daily.    mirtazapine (REMERON) 15 MG tablet Take 15 mg by mouth at bedtime.    Multiple Vitamins-Minerals (MULTIVITAMIN WITH MINERALS) tablet Take 1 tablet by mouth daily.    saccharomyces boulardii (FLORASTOR) 250 MG capsule Take 1 capsule (250 mg total) by mouth 2 (two) times daily.    venlafaxine (EFFEXOR) 37.5 MG tablet Take 37.5 mg by mouth daily.    !! insulin aspart (NOVOLOG) 100 UNIT/ML injection Inject 0-9 Units into the skin 3 (three) times daily with meals. Qty: 10 mL, Refills: 11    metroNIDAZOLE (FLAGYL) 500 MG tablet Take 1 tablet (500 mg total) by mouth every 8 (eight) hours. Please take for 4 weeks     !! - Potential duplicate medications found. Please discuss with provider.    STOP taking these medications     ciprofloxacin (CIPRO) 500 MG tablet      doxycycline (VIBRA-TABS) 100 MG tablet        Allergies  Allergen Reactions  . Poison Oak Extract Rash      The results of significant diagnostics from this hospitalization (including imaging, microbiology, ancillary and laboratory) are listed below for reference.    Significant Diagnostic Studies: Dg Foot 2 Views Left  Result Date: 03/11/2017 CLINICAL DATA:  Diabetic foot ulcer EXAM: LEFT FOOT - 2 VIEW COMPARISON:  None. FINDINGS: Amputation of the fifth toe at the level of the distal metatarsal. No evidence of osteomyelitis in the fifth metatarsal Wound along the medial surface of the first interphalangeal joint with debris in the wound. Negative for osteomyelitis. Negative for fracture IMPRESSION: Negative for osteomyelitis. Electronically Signed   By: Franchot Gallo M.D.   On: 03/11/2017 09:04   Dg Foot 2 Views Right  Result Date: 03/10/2017 CLINICAL DATA:  61 y/o F; chronic ischemic wound of the right foot with maggots. EXAM: RIGHT FOOT - 2 VIEW COMPARISON:  None. FINDINGS: Large erosion within the lateral first metatarsal head, heads of the 2nd to 4th metatarsals,  base of the second proximal phalanx, and diffuse deformity of the first proximal phalanx with mixed lytic and sclerotic changes compatible with acute and chronic osteomyelitis. Soft tissue swelling of the foot. Periosteal reaction is present along the diaphysis of the first through the fourth metatarsal bones indicating chronic inflammation.There also may be erosions of the medial aspect of the fifth proximal phalanx. IMPRESSION: Acute on chronic osteomyelitis involving the 1st to 4th digits with erosion of 1st to 4th metatarsal heads and the 1st to 2nd proximal phalanx. There also may be erosions of the medial aspect of the fifth proximal phalanx. Extensive soft tissue swelling. Electronically Signed   By: Kristine Garbe M.D.   On: 03/10/2017 22:08    Microbiology: Recent Results (from the past 240 hour(s))  Blood culture (routine x 2)     Status: None (Preliminary result)   Collection Time: 03/10/17  6:05 PM  Result Value Ref Range Status   Specimen Description BLOOD RIGHT ANTECUBITAL  Final   Special Requests   Final    BOTTLES DRAWN AEROBIC AND ANAEROBIC Blood Culture adequate volume   Culture   Final    NO GROWTH 2 DAYS Performed at [facility], [address], [place], [place] [address]    Report Status PENDING  Incomplete  Blood culture (routine x 2)     Status: None (Preliminary result)   Collection Time: 03/10/17 10:16 PM  Result Value Ref Range Status   Specimen Description BLOOD LEFT ANTECUBITAL  Final   Special Requests   Final    BOTTLES DRAWN AEROBIC AND ANAEROBIC Blood Culture adequate volume   Culture  Setup Time   Final    GRAM POSITIVE COCCI IN CLUSTERS IN BOTH AEROBIC AND ANAEROBIC BOTTLES CRITICAL RESULT CALLED TO, READ BACK BY AND VERIFIED WITH: Jene Every RN [id number] 03/11/17 A BROWNING CRITICAL RESULT CALLED TO, READ BACK BY AND VERIFIED WITH: G.ABBOTT, PHARMD 03/11/17 2348  L.CHAMPION    Culture   Final    GRAM POSITIVE COCCI CULTURE REINCUBATED FOR BETTER  GROWTH Performed at [facility], [place] [address], [place], [place] [address]    Report Status PENDING  Incomplete  Blood Culture ID Panel (Reflexed)     Status: Abnormal   Collection Time: 03/10/17 10:16 PM  Result Value Ref Range Status   Enterococcus species NOT DETECTED NOT DETECTED Final   Vancomycin resistance NOT DETECTED NOT DETECTED Final   Listeria monocytogenes NOT DETECTED NOT DETECTED Final   Staphylococcus species DETECTED (A) NOT DETECTED Final    Comment: CRITICAL RESULT CALLED TO, READ BACK BY AND VERIFIED WITH: Jene Every RN [id number] 03/11/17 A BROWNING G.ABBOTT, PHARMD 03/11/17 2348 L.CHAMPION    Staphylococcus aureus NOT DETECTED NOT DETECTED Final   Methicillin resistance DETECTED (A) NOT DETECTED Final    Comment: CRITICAL RESULT CALLED TO, READ BACK BY AND VERIFIED WITH: Jene Every RN [id number] 03/11/17 A BROWNING G.ABBOTT, PHARMD 03/11/17 2348 L.CHAMPION    Streptococcus species NOT DETECTED NOT DETECTED Final   Streptococcus agalactiae NOT DETECTED NOT DETECTED Final   Streptococcus pneumoniae NOT DETECTED NOT DETECTED Final   Streptococcus pyogenes NOT DETECTED NOT DETECTED Final   Acinetobacter baumannii NOT DETECTED NOT DETECTED Final   Enterobacteriaceae species NOT DETECTED NOT DETECTED Final   Enterobacter cloacae complex NOT DETECTED NOT DETECTED Final   Escherichia coli NOT DETECTED NOT DETECTED Final   Klebsiella oxytoca NOT DETECTED NOT DETECTED Final   Klebsiella pneumoniae NOT DETECTED NOT DETECTED Final   Proteus species NOT DETECTED NOT DETECTED Final   Serratia marcescens NOT DETECTED NOT DETECTED Final   Carbapenem resistance NOT DETECTED NOT DETECTED Final   Haemophilus influenzae NOT DETECTED NOT DETECTED Final   Neisseria meningitidis NOT DETECTED NOT DETECTED Final   Pseudomonas aeruginosa NOT DETECTED NOT DETECTED Final   Candida albicans NOT DETECTED NOT DETECTED Final   Candida glabrata NOT DETECTED NOT DETECTED Final   Candida krusei NOT  DETECTED NOT DETECTED Final   Candida parapsilosis NOT DETECTED NOT DETECTED Final   Candida tropicalis NOT DETECTED NOT DETECTED Final    Comment: Performed at [facility], [place] [address], [place],  [address]     Labs: Basic Metabolic Panel:  Recent Labs Lab 03/10/17 1805 03/11/17 0342 03/12/17 0300  NA 138 137  --   K 4.1 3.7  --   CL 102 102  --   CO2 27 27  --   GLUCOSE 121* 106*  --  BUN 27* 21*  --   CREATININE 0.84 0.90 0.86  CALCIUM 9.6 9.2  --    Liver Function Tests:  Recent Labs Lab 03/10/17 1805  AST 19  ALT 12*  ALKPHOS 84  BILITOT 0.7  PROT 8.2*  ALBUMIN 3.5   No results for input(s): LIPASE, AMYLASE in the last 168 hours. No results for input(s): AMMONIA in the last 168 hours. CBC:  Recent Labs Lab 03/10/17 1805 03/11/17 0342  WBC 9.6 10.1  NEUTROABS 5.2  --   HGB 13.5 11.6*  HCT 40.2 35.7*  MCV 86.3 86.0  PLT 448* 403*   Cardiac Enzymes: No results for input(s): CKTOTAL, CKMB, CKMBINDEX, TROPONINI in the last 168 hours. BNP: BNP (last 3 results) No results for input(s): BNP in the last 8760 hours.  ProBNP (last 3 results) No results for input(s): PROBNP in the last 8760 hours.  CBG:  Recent Labs Lab 03/11/17 1147 03/11/17 1644 03/11/17 2045 03/12/17 0626 03/12/17 1156  GLUCAP 149* 178* 181* 199* 176*    Signed:  Velvet Bathe MD.  [facility] 03/12/2017, 4:32 PM

## 2017-03-12 NOTE — Progress Notes (Signed)
This writer was about to do a wound care dressing change on the patient but the patient refused and asked if a wound care nurse would be here on dayshift to do dressing change.

## 2017-03-12 NOTE — Progress Notes (Signed)
This Probation officer once again told pt to have wound care dressing change done; even told pt that wound care treatment nurse will not be here on weekends but left instructions for staff nurses on how to handle dressing changes with regards to her case; still patient said "can we just do that later on the day;i'm still trying to get some more sleep here".Extremely malodorous smell noted inside her room;pt is on flagyl,rocephin and vancomycin IV antibiotics.

## 2017-03-12 NOTE — Progress Notes (Signed)
Patient will be returning to [facility] at discharge. Per facility representative, patient can arrive to facility at anytime. Patient and RN aware that Corey Harold has been arranged. Patient was appreciative of CSW services.  Patient to be transported via [place] to [facility] on today. D/C Summary to be sent via Hub system once available. No further needs were requested at this time. CSW to sign off.   Please re-consult if further CSW needs arise.   Lucius Conn, [facility] Ph: [phone]

## 2017-03-13 LAB — CULTURE, BLOOD (ROUTINE X 2): SPECIAL REQUESTS: ADEQUATE

## 2017-03-15 LAB — CULTURE, BLOOD (ROUTINE X 2)
Culture: NO GROWTH
Special Requests: ADEQUATE

## 2017-12-28 ENCOUNTER — Telehealth: Payer: Self-pay | Admitting: *Deleted

## 2017-12-28 NOTE — Telephone Encounter (Signed)
Called and spoke with Erline Levine, gave date/time of appt for tomorrow

## 2017-12-29 ENCOUNTER — Encounter: Payer: Self-pay | Admitting: Gynecologic Oncology

## 2017-12-29 ENCOUNTER — Encounter: Payer: Self-pay | Admitting: General Practice

## 2017-12-29 ENCOUNTER — Telehealth: Payer: Self-pay | Admitting: Oncology

## 2017-12-29 ENCOUNTER — Inpatient Hospital Stay: Payer: Medicaid Other | Attending: Gynecologic Oncology | Admitting: Gynecologic Oncology

## 2017-12-29 ENCOUNTER — Inpatient Hospital Stay: Payer: Medicaid Other

## 2017-12-29 VITALS — BP 135/70 | HR 84 | Temp 98.5°F | Resp 18 | Ht 70.0 in | Wt 178.0 lb

## 2017-12-29 DIAGNOSIS — C7800 Secondary malignant neoplasm of unspecified lung: Secondary | ICD-10-CM | POA: Diagnosis not present

## 2017-12-29 DIAGNOSIS — N838 Other noninflammatory disorders of ovary, fallopian tube and broad ligament: Secondary | ICD-10-CM

## 2017-12-29 DIAGNOSIS — L97519 Non-pressure chronic ulcer of other part of right foot with unspecified severity: Secondary | ICD-10-CM | POA: Insufficient documentation

## 2017-12-29 DIAGNOSIS — Z5111 Encounter for antineoplastic chemotherapy: Secondary | ICD-10-CM | POA: Diagnosis present

## 2017-12-29 DIAGNOSIS — Z87891 Personal history of nicotine dependence: Secondary | ICD-10-CM | POA: Insufficient documentation

## 2017-12-29 DIAGNOSIS — C541 Malignant neoplasm of endometrium: Secondary | ICD-10-CM | POA: Insufficient documentation

## 2017-12-29 DIAGNOSIS — Z2239 Carrier of other specified bacterial diseases: Secondary | ICD-10-CM | POA: Insufficient documentation

## 2017-12-29 DIAGNOSIS — F17210 Nicotine dependence, cigarettes, uncomplicated: Secondary | ICD-10-CM | POA: Insufficient documentation

## 2017-12-29 DIAGNOSIS — Z794 Long term (current) use of insulin: Secondary | ICD-10-CM | POA: Insufficient documentation

## 2017-12-29 DIAGNOSIS — R971 Elevated cancer antigen 125 [CA 125]: Secondary | ICD-10-CM | POA: Diagnosis not present

## 2017-12-29 DIAGNOSIS — Z807 Family history of other malignant neoplasms of lymphoid, hematopoietic and related tissues: Secondary | ICD-10-CM | POA: Insufficient documentation

## 2017-12-29 DIAGNOSIS — E10621 Type 1 diabetes mellitus with foot ulcer: Secondary | ICD-10-CM | POA: Diagnosis not present

## 2017-12-29 DIAGNOSIS — Z803 Family history of malignant neoplasm of breast: Secondary | ICD-10-CM | POA: Diagnosis not present

## 2017-12-29 DIAGNOSIS — C787 Secondary malignant neoplasm of liver and intrahepatic bile duct: Secondary | ICD-10-CM | POA: Insufficient documentation

## 2017-12-29 DIAGNOSIS — E1042 Type 1 diabetes mellitus with diabetic polyneuropathy: Secondary | ICD-10-CM | POA: Diagnosis not present

## 2017-12-29 DIAGNOSIS — E1143 Type 2 diabetes mellitus with diabetic autonomic (poly)neuropathy: Secondary | ICD-10-CM

## 2017-12-29 DIAGNOSIS — Z8042 Family history of malignant neoplasm of prostate: Secondary | ICD-10-CM | POA: Diagnosis not present

## 2017-12-29 MED ORDER — LEVONORGESTREL 20 MCG/24HR IU IUD: 1.0000 | INTRAUTERINE_SYSTEM | Freq: Once | INTRAUTERINE | 0 refills | Status: AC

## 2017-12-29 NOTE — Patient Instructions (Addendum)
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  Plan to have a dilation and curettage of the uterus with IUD placement at the [facility] on January 03, 2018.  You will receive a phone call from the pre-surgical RN to go over instructions several days prior to the procedure.     We will plan to see you back in the office in September with an ultrasound before that visit.  Your ultrasound is scheduled at [facility] on April 03, 2018 at 1pm.     Dilation and Curettage or Vacuum Curettage  Dilation and curettage (D&C) and vacuum curettage are minor procedures. A D&C involves stretching (dilation) the cervix and scraping (curettage) the inside lining of the uterus (endometrium). During a D&C, tissue is gently scraped from the endometrium, starting from the top portion of the uterus down to the lowest part of the uterus (cervix). During a vacuum curettage, the lining and tissue in the uterus are removed with the use of gentle suction. Curettage may be performed to either diagnose or treat a problem. As a diagnostic procedure, curettage is performed to examine tissues from the uterus. A diagnostic curettage may be done if you have:  Irregular bleeding in the uterus.  Bleeding with the development of clots.  Spotting between menstrual periods.  Prolonged menstrual periods or other abnormal bleeding.  Bleeding after menopause.  No menstrual period (amenorrhea).  A change in size and shape of the uterus.  Abnormal endometrial cells discovered during a Pap test.  As a treatment procedure, curettage may be performed for the following reasons:  Removal of an IUD (intrauterine device).  Removal of retained placenta after giving birth.  Abortion.  Miscarriage.  Removal of endometrial polyps.  Removal of uncommon types of noncancerous lumps (fibroids).  Tell a health care provider about:  Any allergies you have, including allergies to prescribed medicine or  latex.  All medicines you are taking, including vitamins, herbs, eye drops, creams, and over-the-counter medicines. This is especially important if you take any blood-thinning medicine. Bring a list of all of your medicines to your appointment.  Any problems you or family members have had with anesthetic medicines.  Any blood disorders you have.  Any surgeries you have had.  Your medical history and any medical conditions you have.  Whether you are pregnant or may be pregnant.  Recent vaginal infections you have had.  Recent menstrual periods, bleeding problems you have had, and what form of birth control (contraception) you use. What are the risks? Generally, this is a safe procedure. However, problems may occur, including:  Infection.  Heavy vaginal bleeding.  Allergic reactions to medicines.  Damage to the cervix or other structures or organs.  Development of scar tissue (adhesions) inside the uterus, which can cause abnormal amounts of menstrual bleeding. This may make it harder to get pregnant in the future.  A hole (perforation) or puncture in the uterine wall. This is rare.  What happens before the procedure? Staying hydrated Follow instructions from your health care provider about hydration, which may include:  Up to 2 hours before the procedure - you may continue to drink clear liquids, such as water, clear fruit juice, black coffee, and plain tea.  Eating and drinking restrictions Follow instructions from your health care provider about eating and drinking, which may include:  8 hours before the procedure - stop eating heavy meals or foods such as meat, fried foods, or fatty foods.  6 hours before  the procedure - stop eating light meals or foods, such as toast or cereal.  6 hours before the procedure - stop drinking milk or drinks that contain milk.  2 hours before the procedure - stop drinking clear liquids. If your health care provider told you to take your  medicine(s) on the day of your procedure, take them with only a sip of water.  Medicines  Ask your health care provider about: ? Changing or stopping your regular medicines. This is especially important if you are taking diabetes medicines or blood thinners. ? Taking medicines such as aspirin and ibuprofen. These medicines can thin your blood. Do not take these medicines before your procedure if your health care provider instructs you not to.  You may be given antibiotic medicine to help prevent infection. General instructions  For 24 hours before your procedure, do not: ? Douche. ? Use tampons. ? Use medicines, creams, or suppositories in the vagina. ? Have sexual intercourse.  You may be given a pregnancy test on the day of the procedure.  Plan to have someone take you home from the hospital or clinic.  You may have a blood or urine sample taken.  If you will be going home right after the procedure, plan to have someone with you for 24 hours. What happens during the procedure?  To reduce your risk of infection: ? Your health care team will wash or sanitize their hands. ? Your skin will be washed with soap.  An IV tube will be inserted into one of your veins.  You will be given one of the following: ? A medicine that numbs the area in and around the cervix (local anesthetic). ? A medicine to make you fall asleep (general anesthetic).  You will lie down on your back, with your feet in foot rests (stirrups).  The size and position of your uterus will be checked.  A lubricated instrument (speculum or Sims retractor) will be inserted into the back side of your vagina. The speculum will be used to hold apart the walls of your vagina so your health care provider can see your cervix.  A tool (tenaculum) will be attached to the lip of the cervix to stabilize it.  Your cervix will be softened and dilated. This may be done by: ? Taking a medicine. ? Having tapered dilators or  thin rods (laminaria) or gradual widening instruments (tapered dilators) inserted into your cervix.  A small, sharp, curved instrument (curette) will be used to scrape a small amount of tissue or cells from the endometrium or cervical canal. In some cases, gentle suction is applied with the curette. The curette will then be removed. The cells will be taken to a lab for testing. The procedure may vary among health care providers and hospitals. What happens after the procedure?  You may have mild cramping, backache, pain, and light bleeding or spotting. You may pass small blood clots from your vagina.  You may have to wear compression stockings. These stockings help to prevent blood clots and reduce swelling in your legs.  Your blood pressure, heart rate, breathing rate, and blood oxygen level will be monitored until the medicines you were given have worn off. Summary  Dilation and curettage (D&C) involves stretching (dilation) the cervix and scraping (curettage) the inside lining of the uterus (endometrium).  After the procedure, you may have mild cramping, backache, pain, and light bleeding or spotting. You may pass small blood clots from your vagina.  Plan to have  someone take you home from the hospital or clinic. This information is not intended to replace advice given to you by your health care provider. Make sure you discuss any questions you have with your health care provider. Document Released: 07/12/2005 Document Revised: 03/28/2016 Document Reviewed: 03/28/2016 Elsevier Interactive Patient Education  2018 [employer].   Intrauterine Device Insertion An intrauterine device (IUD) is a medical device that gets inserted into the uterus to prevent pregnancy. It is a small, T-shaped device that has one or two nylon strings hanging down from it. The strings hang out of the lower part of the uterus (cervix) to allow for future IUD removal. There are two types of IUDs available:  Copper  IUD. This type of IUD has copper wire wrapped around it. Copper makes the uterus and fallopian tubes produce a fluid that kills sperm. A copper IUD may last up to 10 years.  Hormone IUD. This type of IUD is made of plastic and contains the hormone progestin (synthetic progesterone). The hormone thickens mucus in the cervix and prevents sperm from entering the uterus. It also thins the uterine lining to prevent implantation of a fertilized egg. The hormone can weaken or kill the sperm that get into the uterus. A hormone IUD may last 3-5 years. Tell a health care provider about:  Any allergies you have.  All medicines you are taking, including vitamins, herbs, eye drops, creams, and over-the-counter medicines.  Any problems you or family members have had with anesthetic medicines.  Any blood disorders you have.  Any surgeries you have had.  Any medical conditions you have, including any STIs (sexually transmitted infections) you may have.  Whether you are pregnant or may be pregnant. What are the risks? Generally, this is a safe procedure. However, problems may occur, including:  Infection.  Bleeding.  Allergic reactions to medicines.  Accidental puncture (perforation) of the uterus, or damage to other structures or organs.  Accidental placement of the IUD either in the muscle layer of the uterus (myometrium) or outside the uterus.  The IUD falling out of the uterus (expulsion). This is more common among women who have recently had a child.  Pregnancy that happens in the fallopian tube (ectopic pregnancy).  Infection of the uterus and fallopian tubes (pelvic inflammatory disease). What happens before the procedure?  Schedule the IUD insertion for when you will have your menstrual period or right after, to make sure you are not pregnant. Placement of the IUD is better tolerated shortly after a menstrual cycle.  Follow instructions from your health care provider about eating or  drinking restrictions.  Ask your health care provider about changing or stopping your regular medicines. This is especially important if you are taking diabetes medicines or blood thinners.  You may get a pain reliever to take before the procedure.  You may have tests for:  Pregnancy. A pregnancy test involves having a urine sample taken.  STIs. Placing an IUD in someone who has an STI can make the infection worse.  Cervical cancer. You may have a Pap test to check for this type of cancer. This means collecting cells from your cervix to be examined under a microscope.  You may have a physical exam to determine the size and position of your uterus. The procedure may vary among health care providers and hospitals. What happens during the procedure?  A tool (speculum) will be placed in your vagina and widened so that your health care provider can see your cervix.  Medicine may be applied to your cervix to help lower your risk of infection (antiseptic medicine).  You may be given an anesthetic medicine to numb each side of your cervix (intracervical block or paracervical block). This medicine is usually given by an injection into the cervix.  A tool (uterine sound) will be inserted into your uterus to determine the length of your uterus and the direction that your uterus may be tilted.  A slim instrument or tube (IUD inserter) that holds the IUD will be inserted into your vagina, through your cervical canal, and into your uterus.  The IUD will be placed in the uterus, and the IUD inserter will be removed.  The strings that are attached to the IUD will be trimmed so that they lie just below the cervix. The procedure may vary among health care providers and hospitals. What happens after the procedure?  You may have bleeding after the procedure. This is normal. It varies from light bleeding (spotting) for a few days to menstrual-like bleeding.  You may have cramping and pain.  You may  feel dizzy or light-headed.  You may have lower back pain. Summary  An intrauterine device (IUD) is a small, T-shaped device that has one or two nylon strings hanging down from it.  Two types of IUDs are available. You may have a copper IUD or a hormone IUD.  Schedule the IUD insertion for when you will have your menstrual period or right after, to make sure you are not pregnant. Placement of the IUD is better tolerated shortly after a menstrual cycle.  You may have bleeding after the procedure. This is normal. It varies from light spotting for a few days to menstrual-like bleeding. This information is not intended to replace advice given to you by your health care provider. Make sure you discuss any questions you have with your health care provider. Document Released: 03/10/2011 Document Revised: 06/02/2016 Document Reviewed: 06/02/2016 Elsevier Interactive Patient Education  2017 [employer].  Levonorgestrel intrauterine device (IUD)  What is this medicine? LEVONORGESTREL IUD (LEE voe nor jes trel) is a contraceptive (birth control) device. The device is placed inside the uterus by a healthcare professional. It is used to prevent pregnancy. This device can also be used to treat heavy bleeding that occurs during your period. This medicine may be used for other purposes; ask your health care provider or pharmacist if you have questions. COMMON BRAND NAME(S): Minette Headland What should I tell my health care provider before I take this medicine? They need to know if you have any of these conditions: -abnormal Pap smear -cancer of the breast, uterus, or cervix -diabetes -endometritis -genital or pelvic infection now or in the past -have more than one sexual partner or your partner has more than one partner -heart disease -history of an ectopic or tubal pregnancy -immune system problems -IUD in place -liver disease or tumor -problems with blood clots or take  blood-thinners -seizures -use intravenous drugs -uterus of unusual shape -vaginal bleeding that has not been explained -an unusual or allergic reaction to levonorgestrel, other hormones, silicone, or polyethylene, medicines, foods, dyes, or preservatives -pregnant or trying to get pregnant -breast-feeding How should I use this medicine? This device is placed inside the uterus by a health care professional. Talk to your pediatrician regarding the use of this medicine in children. Special care may be needed. Overdosage: If you think you have taken too much of this medicine contact a poison control center or emergency  room at once. NOTE: This medicine is only for you. Do not share this medicine with others.  What if I miss a dose? This does not apply. Depending on the brand of device you have inserted, the device will need to be replaced every 3 to 5 years if you wish to continue using this type of birth control.  What may interact with this medicine? Do not take this medicine with any of the following medications: -amprenavir -bosentan -fosamprenavir This medicine may also interact with the following medications: -aprepitant -armodafinil -barbiturate medicines for inducing sleep or treating seizures -bexarotene -boceprevir -griseofulvin -medicines to treat seizures like carbamazepine, ethotoin, felbamate, oxcarbazepine, phenytoin, topiramate -modafinil -pioglitazone -rifabutin -rifampin -rifapentine -some medicines to treat HIV infection like atazanavir, efavirenz, indinavir, lopinavir, nelfinavir, tipranavir, ritonavir -St. John's wort -warfarin This list may not describe all possible interactions. Give your health care provider a list of all the medicines, herbs, non-prescription drugs, or dietary supplements you use. Also tell them if you smoke, drink alcohol, or use illegal drugs. Some items may interact with your medicine.  What should I watch for while using this  medicine? Visit your doctor or health care professional for regular check ups. See your doctor if you or your partner has sexual contact with others, becomes HIV positive, or gets a sexual transmitted disease. This product does not protect you against HIV infection (AIDS) or other sexually transmitted diseases. You can check the placement of the IUD yourself by reaching up to the top of your vagina with clean fingers to feel the threads. Do not pull on the threads. It is a good habit to check placement after each menstrual period. Call your doctor right away if you feel more of the IUD than just the threads or if you cannot feel the threads at all. The IUD may come out by itself. You may become pregnant if the device comes out. If you notice that the IUD has come out use a backup birth control method like condoms and call your health care provider. Using tampons will not change the position of the IUD and are okay to use during your period. This IUD can be safely scanned with magnetic resonance imaging (MRI) only under specific conditions. Before you have an MRI, tell your healthcare provider that you have an IUD in place, and which type of IUD you have in place.  What side effects may I notice from receiving this medicine? Side effects that you should report to your doctor or health care professional as soon as possible: -allergic reactions like skin rash, itching or hives, swelling of the face, lips, or tongue -fever, flu-like symptoms -genital sores -high blood pressure -no menstrual period for 6 weeks during use -pain, swelling, warmth in the leg -pelvic pain or tenderness -severe or sudden headache -signs of pregnancy -stomach cramping -sudden shortness of breath -trouble with balance, talking, or walking -unusual vaginal bleeding, discharge -yellowing of the eyes or skin Side effects that usually do not require medical attention (report to your doctor or health care professional if they  continue or are bothersome): -acne -breast pain -change in sex drive or performance -changes in weight -cramping, dizziness, or faintness while the device is being inserted -headache -irregular menstrual bleeding within first 3 to 6 months of use -nausea This list may not describe all possible side effects. Call your doctor for medical advice about side effects. You may report side effects to FDA at 1-800-FDA-1088. Where should I keep my medicine? This does not  apply. NOTE: This sheet is a summary. It may not cover all possible information. If you have questions about this medicine, talk to your doctor, pharmacist, or health care provider.  2018 Elsevier/Gold Standard (2016-04-23 14:14:56)

## 2017-12-29 NOTE — Progress Notes (Signed)
[facility] CSW Progress Notes  Call from nurse navigator, patient is requesting help w transportation for upcoming appointments. Per [facility], who has verified information in [facility], patient is a resident of [place] and [place] and has [employer].  Per facility, she is discharging on Friday June 7 and uses a wheelchair, minimal ambulation.  Patients [employer] worker is Princess Perna [phone]).  Until patient changes her county of residence and address in [place] system, [place] is responsible for transport needs.  CSW has left a message w CSW Elnoria Howard to clarify and request assistance.    Edwyna Shell, LCSW Clinical Social Worker Phone:  [phone]

## 2017-12-29 NOTE — Progress Notes (Signed)
[place] CSW Progress Note  Return call from [place] Medicaid worker - when patient leaves [place] (where she is currently covered under long term care Medicaid), worker will need to reassess patient for full Medicaid.  Full Medicaid does provide transportation coverage; patient's current Medicaid coverage does not cover transportation.  Medicaid worker will reassess patient after receiving notification of discharge from SNF, aware that patient would like transport for upcoming surgery however cannot schedule patient for Medicaid transport at this time due to lack of coverage.  Edwyna Shell, LCSW Clinical Social Worker Phone:  [phone]

## 2017-12-29 NOTE — Telephone Encounter (Signed)
Called Brenda Moore and advised her that her current Medicaid coverage does not cover transportation and that she will be reevaluated for full Medicaid coverage when she is discharged from [facility] per Edwyna Shell, LCSW note.  She said she is working with a Education officer, museum at [employer] and will contact her today.  She also said she was exploring options for transportation to future appointments and does have someone that can go with her to surgery on 01/03/18.  Advised her to call if she has any questions or concerns.

## 2017-12-29 NOTE — Progress Notes (Signed)
Consult Note: Gyn-Onc  Consult was requested by Dr. Deatra Ina for the evaluation of Brenda Moore 62 y.o. female  CC:  Chief Complaint  Patient presents with  . Endometrial cancer [facility])    Assessment/Plan:  Ms. Brenda Moore  is a 62 y.o.  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  A detailed discussion was held with the patient and her family with regard to to her endometrial cancer diagnosis. We discussed the standard management options for uterine cancer which includes surgery followed possibly by adjuvant therapy depending on the results of surgery. The options for surgical management include a hysterectomy and removal of the tubes and ovaries possibly with removal of pelvic and para-aortic lymph nodes.If feasible, a minimally invasive approach including a robotic hysterectomy or laparoscopic hysterectomy have benefits including shorter hospital stay, recovery time and better wound healing than with open surgery. The patient has been counseled about these surgical options and the risks of surgery in general including infection, bleeding, damage to surrounding structures (including bowel, bladder, ureters, nerves or vessels), and the postoperative risks of PE/ DVT, and lymphedema. I extensively reviewed the additional risks of robotic hysterectomy including possible need for conversion to open laparotomy.  I discussed positioning during surgery of trendelenberg and risks of minor facial swelling and care we take in preoperative positioning.  I discussed that her perioperative infection risk is particularly high given her history (including open wound and VRE colonization, in addition to diabetes).   I discussed alternative to surgery being hormonal suppression with progestin releasing IUD or oral progestins after D&C with repeat imaging of the ovary in 14months and repeat  endometrial sampling at that time. I discussed that this option eliminates the risk of major surgical wound infection, however, does not stage the cancer, and does not offer diagnosis or treatment of the ovarian mass. I discussed that IUD placement was associated with risks of infection, perforation, bleeding and nuisances spotting and discomfort. I discussed oral progestins are associated with risk of weight gain, hair loss, edema, pulmonary edema, VTE.   After counseling and consideration of her options, she desires to proceed with D&C and IUD placement with resampling in 3 months and reimaging in 3 months.  We will obtain CA 125 today to better evaluate the ovary.   She will be seen by anesthesia for preoperative clearance and discussion of postoperative pain management.  She was given the opportunity to ask questions, which were answered to her satisfaction, and she is agreement with the above mentioned plan of care.  HPI: [name] is a 62 year old P2 who is seen in consultation at the request of Dr Deatra Ina for grade 1 endometrial cancer and a left ovarian cystic complex mass.  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 After 1 year of aggressive wound care, she has demonstrated enough improvement that discharge is planned for 12/30/17 where she will return home with her parents for outpatient management. She is colonized with VRE.   She was diagnosed with diabetes mellitus in 2018 at the time of the diagnosis of gangrene of her foot. Her diabetes is complicated by diabetic neuropathy and vasculopathy.  She denies coronary or cerebrovascular disease (or symptoms of this). She denies retinopathy or nephropathy.  She began experiencing intermittent vaginal spotting in May, 2019. This  was initially treated with antibiotics for a UTI, however, she when it persisted she was seen by Dr Deatra Ina on  12/02/17 and an TVUS was performed which revealed uterus measuring 9x6.4x6cm with 3cm endometrium. Right ovary measures 2cm (normal), left ovary measures 4.1x3x3.2cm with increased volume and increased echogenicity and central complex area with no fluid in CDS. An endometrial pipelle biopsy was performed the same day which revealed grade 1 endometrioid endometrial cancer.  She has had no prior surgeries. She has a family history of a brother and father with prostate cancer and a mother with lymphoma. She has had 2 prior vaginal deliveries. She has no history of HRT use.     Current Meds:  Outpatient Encounter Medications as of 12/29/2017  Medication Sig  . acetaminophen (TYLENOL) 325 MG tablet Take 2 tablets (650 mg total) by mouth every 6 (six) hours as needed for mild pain (or Fever >/= 101).  . calcium carbonate (TUMS - DOSED IN MG ELEMENTAL CALCIUM) 500 MG chewable tablet Chew 1 tablet by mouth every 4 (four) hours as needed for indigestion or heartburn.  . Cholecalciferol (VITAMIN D3) 1000 units CAPS Take 1 capsule by mouth daily.  . insulin glargine (LANTUS) 100 UNIT/ML injection Inject 0.08 mLs (8 Units total) into the skin daily. (Patient taking differently: Inject 20 Units into the skin at bedtime. )  . insulin regular (NOVOLIN R,HUMULIN R) 100 units/mL injection Inject 5 Units into the skin 3 (three) times daily before meals. Hold if glucose less than 100.  . levothyroxine (SYNTHROID, LEVOTHROID) 25 MCG tablet Take 25 mcg by mouth daily before breakfast.  . Multiple Vitamins-Minerals (MULTIVITAMIN WITH MINERALS) tablet Take 1 tablet by mouth daily.  . nitrofurantoin, macrocrystal-monohydrate, (MACROBID) 100 MG capsule Take 100 mg by mouth 2 (two) times daily.  Marland Kitchen saccharomyces boulardii (FLORASTOR) 250 MG capsule Take 1 capsule (250 mg total) by mouth 2 (two) times daily.  . traMADol (ULTRAM) 50 MG tablet Take by mouth every 6 (six) hours as needed.  Marland Kitchen levonorgestrel (MIRENA) 20 MCG/24HR  IUD 1 Intra Uterine Device (1 each total) by Intrauterine route once for 1 dose.  . [DISCONTINUED] ferrous sulfate 325 (65 FE) MG tablet Take 325 mg by mouth daily with breakfast.  . [DISCONTINUED] insulin aspart (NOVOLOG) 100 UNIT/ML injection Inject 0-5 Units into the skin at bedtime. (Patient taking differently: Inject 0-5 Units into the skin at bedtime. At 2000 per sliding scale - if BS is <70 start hypoglycemic protocol, 70-149 = 0 units, 150-199 = 1 unit, 200-250 = 2 units, 251-299 = 3 units, 300-349 = 4 units, 350-400 = 5 units, >400 = 5 units and call MD/NP)  . [DISCONTINUED] insulin aspart (NOVOLOG) 100 UNIT/ML injection Inject 0-9 Units into the skin 3 (three) times daily with meals. (Patient not taking: Reported on 03/10/2017)  . [DISCONTINUED] methylphenidate (RITALIN) 5 MG tablet Take 5 mg by mouth daily.  . [DISCONTINUED] metroNIDAZOLE (FLAGYL) 500 MG tablet Take 1 tablet (500 mg total) by mouth every 8 (eight) hours. Please take for 4 weeks (Patient not taking: Reported on 03/10/2017)  . [DISCONTINUED] mirtazapine (REMERON) 15 MG tablet Take 15 mg by mouth at bedtime.  . [DISCONTINUED] morphine (MSIR) 15 MG tablet Take 0.5 tablets (7.5 mg total) by mouth every 4 (four) hours as needed for severe pain.  . [DISCONTINUED] venlafaxine (EFFEXOR) 37.5 MG tablet Take 37.5 mg by mouth daily.   No facility-administered encounter medications on file as of 12/29/2017.     Allergy:  Allergies  Allergen Reactions  . Poison Oak Extract Rash    Social Hx:   Social History   Socioeconomic History  . Marital status: Divorced    Spouse name: Not on file  . Number of children: Not on file  . Years of education: Not on file  . Highest education level: Not on file  Occupational History  . Not on file  Social Needs  . Financial resource strain: Not on file  . Food insecurity:    Worry: Not on file    Inability: Not on file  . Transportation needs:    Medical: Not on file    Non-medical: Not  on file  Tobacco Use  . Smoking status: Former Smoker    Packs/day: 0.75    Years: 40.00    Pack years: 30.00    Last attempt to quit: 03/11/2015    Years since quitting: 2.8  . Smokeless tobacco: Never Used  Substance and Sexual Activity  . Alcohol use: No  . Drug use: No  . Sexual activity: Not on file  Lifestyle  . Physical activity:    Days per week: Not on file    Minutes per session: Not on file  . Stress: Not on file  Relationships  . Social connections:    Talks on phone: Not on file    Gets together: Not on file    Attends religious service: Not on file    Active member of club or organization: Not on file    Attends meetings of clubs or organizations: Not on file    Relationship status: Not on file  . Intimate partner violence:    Fear of current or ex partner: Not on file    Emotionally abused: Not on file    Physically abused: Not on file    Forced sexual activity: Not on file  Other Topics Concern  . Not on file  Social History Narrative  . Not on file    Past Surgical Hx:  Past Surgical History:  Procedure Laterality Date  . SKIN GRAFT Left    age 4 , skin graft to left leg due to lawn mower accident  . toe amptuation Left    age 52 - left little toe amputated due to lawn mower accident    Past Medical Hx:  Past Medical History:  Diagnosis Date  . Anemia   . Diabetes mellitus without complication ([place])   . Osteomyelitis of right foot [facility])     Past Gynecological History:  SVD x 2, denies abnormal pap history. No LMP recorded (lmp unknown). Patient is postmenopausal.  Family Hx:  Family History  Problem Relation Age of Onset  . Lymphoma Mother   . Heart disease Mother   . Prostate cancer Father   . Skin cancer Father   . Stroke Father   . Seizures Father   . Epilepsy Brother   . Prostate cancer Brother     Review of Systems:  Constitutional  Feels well,    ENT Normal appearing ears and nares bilaterally Skin/Breast  No rash,  sores, jaundice, itching, dryness Cardiovascular  No chest pain, shortness of breath, or edema  Pulmonary  No cough or wheeze.  Gastro Intestinal  No nausea, vomitting, or diarrhoea. No bright red blood per rectum, no abdominal pain, change in bowel movement, or constipation.  Genito Urinary  No frequency, urgency, dysuria, + postmenopausal bleeding Musculo Skeletal  + unable to walk on right foot due to foot ulcer healing and  history of osteomyelitis.  Neurologic  + neuropathy of feet and hands Psychology  No depression, anxiety, insomnia.   Vitals:  Blood pressure 135/70, pulse 84, temperature 98.5 F (36.9 C), temperature source Oral, resp. rate 18, height 5\' 10"  (1.778 m), weight 178 lb (80.7 kg), SpO2 98 %.  Physical Exam: WD in NAD Neck  Supple NROM, without any enlargements.  Lymph Node Survey No cervical supraclavicular or inguinal adenopathy Cardiovascular  Pulse normal rate, regularity and rhythm. S1 and S2 normal.  Lungs  Clear to auscultation bilateraly, without wheezes/crackles/rhonchi. Good air movement.  Skin  No rash/lesions/breakdown  Psychiatry  Alert and oriented to person, place, and time  Abdomen  Normoactive bowel sounds, abdomen soft, non-tender and mildly overweight without evidence of hernia.  Back No CVA tenderness Genito Urinary  Vulva/vagina: Normal external female genitalia.   No lesions. No discharge or bleeding.  Bladder/urethra:  No lesions or masses, well supported bladder  Vagina: blood, no lesions.   Cervix: Normal appearing, no lesions.  Uterus: slightly bulky, mobile, no parametrial involvement or nodularity.  Adnexa: no palpable masses. Rectal  deferred Extremities  No bilateral cyanosis, clubbing or edema.   Thereasa Solo, MD  12/29/2017, 10:16 AM

## 2017-12-30 ENCOUNTER — Other Ambulatory Visit: Payer: Self-pay | Admitting: Gynecologic Oncology

## 2017-12-30 ENCOUNTER — Telehealth: Payer: Self-pay | Admitting: Gynecologic Oncology

## 2017-12-30 DIAGNOSIS — N838 Other noninflammatory disorders of ovary, fallopian tube and broad ligament: Secondary | ICD-10-CM

## 2017-12-30 DIAGNOSIS — C541 Malignant neoplasm of endometrium: Secondary | ICD-10-CM

## 2017-12-30 DIAGNOSIS — R971 Elevated cancer antigen 125 [CA 125]: Secondary | ICD-10-CM

## 2017-12-30 LAB — CA 125: Cancer Antigen (CA) 125: 50.5 U/mL — ABNORMAL HIGH (ref 0.0–38.1)

## 2017-12-30 NOTE — Telephone Encounter (Signed)
Left message for the patient informing her of her CA 125 results and the new recommendations moving forward.  Advised that the [facility] and IUD placement on Tuesday has been cancelled and we are arranging for her to have a CT scan of the abdomen and pelvis.  Advised she would be contacted with information about her scan then we will set her up to meet with Dr. Denman George in the office to discuss next steps.  Advised patient to please call the office for any questions or concerns about this.

## 2018-01-02 ENCOUNTER — Other Ambulatory Visit: Payer: Self-pay | Admitting: Gynecologic Oncology

## 2018-01-02 ENCOUNTER — Telehealth: Payer: Self-pay | Admitting: *Deleted

## 2018-01-02 DIAGNOSIS — R971 Elevated cancer antigen 125 [CA 125]: Secondary | ICD-10-CM

## 2018-01-02 NOTE — Progress Notes (Unsigned)
Patient returned call to the office and had not listened to her message from Friday.  Advised we were cancelling her procedure [name], IUD) for tomorrow due to CA 125 coming back elevated with plans for CT scan of the abdomen/pelvis then follow up with Dr. Denman George in the office. Patient verbalizing understanding.  Advised she would be contacted with information about her CT scan.

## 2018-01-02 NOTE — Telephone Encounter (Signed)
Called and left a message for the patient to call the office back. Need to schedule a CT scan, lab and MD appt for the patient.

## 2018-01-03 ENCOUNTER — Encounter (HOSPITAL_BASED_OUTPATIENT_CLINIC_OR_DEPARTMENT_OTHER): Payer: Self-pay

## 2018-01-03 ENCOUNTER — Telehealth: Payer: Self-pay | Admitting: *Deleted

## 2018-01-03 ENCOUNTER — Ambulatory Visit (HOSPITAL_BASED_OUTPATIENT_CLINIC_OR_DEPARTMENT_OTHER): Admit: 2018-01-03 | Payer: Medicaid Other | Admitting: Gynecologic Oncology

## 2018-01-03 SURGERY — DILATION AND CURETTAGE
Anesthesia: Choice

## 2018-01-03 NOTE — Telephone Encounter (Signed)
Called and scheduled the patient's CT scan/lab. Called and spoke with the patient and gave the date/time and instructions with the appts

## 2018-01-04 ENCOUNTER — Telehealth: Payer: Self-pay | Admitting: *Deleted

## 2018-01-04 NOTE — Telephone Encounter (Signed)
Patient called and needed to reschedule her CT scan. Appt moved from tomorrow to June 18th. Called and gave the patient the date/time and instructions

## 2018-01-05 ENCOUNTER — Other Ambulatory Visit: Payer: Self-pay

## 2018-01-05 ENCOUNTER — Ambulatory Visit (HOSPITAL_COMMUNITY): Payer: Medicaid Other

## 2018-01-10 ENCOUNTER — Telehealth: Payer: Self-pay

## 2018-01-10 ENCOUNTER — Inpatient Hospital Stay: Payer: Medicaid Other

## 2018-01-10 ENCOUNTER — Ambulatory Visit (HOSPITAL_COMMUNITY)
Admission: RE | Admit: 2018-01-10 | Discharge: 2018-01-10 | Disposition: A | Discharge status: Home / Self Care | Payer: Medicaid Other | Source: Ambulatory Visit | Attending: Gynecologic Oncology | Admitting: Gynecologic Oncology

## 2018-01-10 DIAGNOSIS — R160 Hepatomegaly, not elsewhere classified: Secondary | ICD-10-CM | POA: Insufficient documentation

## 2018-01-10 DIAGNOSIS — C541 Malignant neoplasm of endometrium: Secondary | ICD-10-CM | POA: Insufficient documentation

## 2018-01-10 DIAGNOSIS — N839 Noninflammatory disorder of ovary, fallopian tube and broad ligament, unspecified: Secondary | ICD-10-CM | POA: Diagnosis present

## 2018-01-10 DIAGNOSIS — C7800 Secondary malignant neoplasm of unspecified lung: Secondary | ICD-10-CM | POA: Diagnosis not present

## 2018-01-10 DIAGNOSIS — R971 Elevated cancer antigen 125 [CA 125]: Secondary | ICD-10-CM | POA: Diagnosis not present

## 2018-01-10 DIAGNOSIS — Z5111 Encounter for antineoplastic chemotherapy: Secondary | ICD-10-CM | POA: Diagnosis not present

## 2018-01-10 DIAGNOSIS — N838 Other noninflammatory disorders of ovary, fallopian tube and broad ligament: Secondary | ICD-10-CM

## 2018-01-10 LAB — BASIC METABOLIC PANEL
Anion gap: 10 (ref 3–11)
BUN: 18 mg/dL (ref 7–26)
CALCIUM: 9.7 mg/dL (ref 8.4–10.4)
CO2: 24 mmol/L (ref 22–29)
CREATININE: 1.22 mg/dL — AB (ref 0.60–1.10)
Chloride: 106 mmol/L (ref 98–109)
GFR calc Af Amer: 54 mL/min — ABNORMAL LOW (ref >60)
GFR, EST NON AFRICAN AMERICAN: 47 mL/min — AB (ref >60)
Glucose, Bld: 240 mg/dL — ABNORMAL HIGH (ref 70–140)
POTASSIUM: 4.4 mmol/L (ref 3.5–5.1)
SODIUM: 140 mmol/L (ref 136–145)

## 2018-01-10 MED ORDER — IOPAMIDOL (ISOVUE-300) INJECTION 61%
100.0000 mL | Freq: Once | INTRAVENOUS | Status: AC | PRN
  Administered 2018-01-10: 100 mL via INTRAVENOUS

## 2018-01-10 MED ORDER — IOPAMIDOL (ISOVUE-300) INJECTION 61%
INTRAVENOUS | Status: AC
  Filled 2018-01-10: qty 100

## 2018-01-10 NOTE — Telephone Encounter (Signed)
Incoming call from pt regarding she is returning call to let us know she has transportation here, can come in on Friday for appt - appt set for Fri, June 21 at 11:30 am-pt agreeable. No other needs per pt at this time.

## 2018-01-12 ENCOUNTER — Telehealth: Payer: Self-pay | Admitting: Oncology

## 2018-01-12 NOTE — Telephone Encounter (Signed)
Called patient to ask if she would be able to see Dr. Alvy Bimler at 10:45 before Dr. Serita Grit appointment tomorrow.  She said she would check with her transportation and call back.

## 2018-01-12 NOTE — Telephone Encounter (Signed)
Patient called back and said Medicaid transportation is not able to bring her earlier on Friday.

## 2018-01-13 ENCOUNTER — Encounter: Payer: Self-pay | Admitting: Hematology and Oncology

## 2018-01-13 ENCOUNTER — Inpatient Hospital Stay (HOSPITAL_BASED_OUTPATIENT_CLINIC_OR_DEPARTMENT_OTHER): Payer: Medicaid Other | Admitting: Hematology and Oncology

## 2018-01-13 ENCOUNTER — Encounter: Payer: Self-pay | Admitting: Gynecologic Oncology

## 2018-01-13 ENCOUNTER — Inpatient Hospital Stay (HOSPITAL_BASED_OUTPATIENT_CLINIC_OR_DEPARTMENT_OTHER): Payer: Medicaid Other | Admitting: Gynecologic Oncology

## 2018-01-13 VITALS — BP 163/75 | HR 80 | Temp 98.1°F | Resp 20 | Ht 70.0 in | Wt 178.0 lb

## 2018-01-13 VITALS — BP 148/77 | HR 80 | Temp 98.1°F | Resp 20 | Ht 70.0 in | Wt 178.0 lb

## 2018-01-13 DIAGNOSIS — E1042 Type 1 diabetes mellitus with diabetic polyneuropathy: Secondary | ICD-10-CM | POA: Diagnosis not present

## 2018-01-13 DIAGNOSIS — Z87891 Personal history of nicotine dependence: Secondary | ICD-10-CM | POA: Diagnosis not present

## 2018-01-13 DIAGNOSIS — Z7189 Other specified counseling: Secondary | ICD-10-CM

## 2018-01-13 DIAGNOSIS — E10621 Type 1 diabetes mellitus with foot ulcer: Secondary | ICD-10-CM | POA: Diagnosis not present

## 2018-01-13 DIAGNOSIS — C787 Secondary malignant neoplasm of liver and intrahepatic bile duct: Secondary | ICD-10-CM

## 2018-01-13 DIAGNOSIS — L97519 Non-pressure chronic ulcer of other part of right foot with unspecified severity: Secondary | ICD-10-CM

## 2018-01-13 DIAGNOSIS — C541 Malignant neoplasm of endometrium: Secondary | ICD-10-CM

## 2018-01-13 DIAGNOSIS — C7800 Secondary malignant neoplasm of unspecified lung: Secondary | ICD-10-CM | POA: Diagnosis not present

## 2018-01-13 DIAGNOSIS — Z5111 Encounter for antineoplastic chemotherapy: Secondary | ICD-10-CM | POA: Diagnosis not present

## 2018-01-13 DIAGNOSIS — E11621 Type 2 diabetes mellitus with foot ulcer: Secondary | ICD-10-CM

## 2018-01-13 NOTE — Progress Notes (Signed)
START OFF PATHWAY REGIMEN - Uterine   [id number]:Carboplatin AUC=5 q21 Days:   A cycle is every 21 days:     Carboplatin   **Always confirm dose/schedule in your pharmacy ordering system**  Patient Characteristics: Endometrioid Histology, Newly Diagnosed, Medically Inoperable, Clinical Stage IV Histology: Endometrioid Histology Therapeutic Status: Newly Diagnosed AJCC T Category: T3 AJCC N Category: N1 AJCC M Category: M1 AJCC 8 Stage Grouping: IVB Surgical Status: Medically Inoperable Intent of Therapy: Non-Curative / Palliative Intent, Discussed with Patient

## 2018-01-13 NOTE — Assessment & Plan Note (Signed)
We discussed the plan of care The patient has advanced metastatic endometrial cancer Treatment is strictly palliative She has significant medical comorbidities with poorly controlled diabetes with associated peripheral neuropathy and nonhealing wound I recommend single agent carboplatin every 3 weeks for minimum 3 cycles before repeat imaging study for assessment of response to treatment If she tolerated treatment well and her medical comorbidities improve, we can consider the addition of Taxol in the future I recommend chemo education class, blood work and chemotherapy next week

## 2018-01-13 NOTE — Progress Notes (Signed)
[facility] CONSULT NOTE  Patient Care Team: Patient, No Pcp Per as PCP - General (General Practice)  ASSESSMENT & PLAN:  Endometrial cancer ([place]) We discussed the plan of care The patient has advanced metastatic endometrial cancer Treatment is strictly palliative She has significant medical comorbidities with poorly controlled diabetes with associated peripheral neuropathy and nonhealing wound I recommend single agent carboplatin every 3 weeks for minimum 3 cycles before repeat imaging study for assessment of response to treatment If she tolerated treatment well and her medical comorbidities improve, we can consider the addition of Taxol in the future I recommend chemo education class, blood work and chemotherapy next week  Metastasis to liver [facility]) She is not symptomatic We will check her liver function test next week  Metastasis to lung [facility]) She is not symptomatic Observe for now  Diabetic neuropathy associated with type 1 diabetes mellitus ([place]) She has severe peripheral neuropathy I will avoid Taxol for now  Diabetic ulcer of toe of right foot ([place]) The patient showed me numerous pictures regarding the diabetic ulcer on the right foot It is slowly improving I would defer to wound care team for further management  Goals of care, counseling/discussion The patient is aware she has incurable disease and treatment is strictly palliative. We discussed importance of Advanced Directives and Living will. She has a DNR order at home Her son, Brenda Moore is the dedicated medical healthcare power of attorney   Orders Placed This Encounter  Procedures  . CBC with Differential/Platelet    Standing Status:   Standing    Number of Occurrences:   99    Standing Expiration Date:   01/14/2019  . Comprehensive metabolic panel    Standing Status:   Standing    Number of Occurrences:   22    Standing Expiration Date:   01/14/2019  . CA 125    Standing Status:   Standing     Number of Occurrences:   9    Standing Expiration Date:   01/14/2019     CHIEF COMPLAINTS/PURPOSE OF CONSULTATION:  Metastatic endometrial cancer  HISTORY OF PRESENTING ILLNESS:  Brenda Moore 62 y.o. female is here because of recent diagnosis of metastatic endometrial cancer Her symptoms started with postmenopausal bleeding She had recent evaluation and endometrial biopsy She was referred to GYN oncologist for further follow-up She had intermittent vaginal bleeding a week ago  I have reviewed her chart and materials related to her cancer extensively and collaborated history with the patient. Summary of oncologic history is as follows: Oncology History   Endometrioid with squamous metaplasia     Endometrial cancer ([place])   12/22/2017 Pathology Results    Endometrial biopsy by Dr. Deatra Ina showed well differentiated endometrioid adenocarcinoma with squamous metaplasia (FIGO Grade I).      12/29/2017 Tumor Marker    Patient's tumor was tested for the following markers: CA-125 Results of the tumor marker test revealed 50.5      01/10/2018 Imaging    Large central uterine mass, consistent with known endometrial carcinoma.  Bulky left iliac lymphadenopathy, consistent with metastatic disease.  Left abdominal omental soft tissue nodules and nodule along the capsular surface of the left hepatic lobe, consistent with peritoneal metastases. No evidence of ascites.  3.5 cm right hepatic lobe mass, highly suspicious for liver metastases.  Bibasilar pulmonary metastases.       01/13/2018 Cancer Staging    Staging form: Corpus Uteri - Carcinoma and Carcinosarcoma, AJCC 8th Edition - Clinical:  FIGO Stage IVB (cT3, cN2, cM1) - Signed by Heath Lark, MD on 01/13/2018       Metastasis to lung [facility])   01/13/2018 Initial Diagnosis    Metastasis to lung [facility])       Metastasis to liver ([place])   01/13/2018 Initial Diagnosis    Metastasis to liver ([facility])       Over the past year, she  had chronic nonhealing diabetic ulcer affecting the right foot with gangrene affecting her toes that subsequently became infected and auto amputated Her diabetic ulcer is slowly improving with aggressive dressing changes at home She currently lives with her parents She denies pain Denies further vaginal bleeding the last few days She denies recent weight loss She has severe diabetic neuropathy up to the mid calf on both legs She also have possible visual changes related to diabetes Her fasting blood sugar typically runs around 150 and postprandial over 200 She does not have a primary care doctor right now  MEDICAL HISTORY:  Past Medical History:  Diagnosis Date  . Anemia   . Diabetes mellitus without complication ([place])   . 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 SKIN GRAFT Left    age 70 , skin graft to left leg due to lawn mower accident  . toe amptuation Left    age 10 - left little toe amputated due to lawn mower accident    SOCIAL HISTORY: Social History   Socioeconomic History  . Marital status: Divorced    Spouse name: Not on file  . Number of children: 2  . Years of education: Not on file  . Highest education level: Not on file  Occupational History  . Occupation: unemployed  Social Needs  . Financial resource strain: Not on file  . Food insecurity:    Worry: Not on file    Inability: Not on file  . Transportation needs:    Medical: Not on file    Non-medical: Not on file  Tobacco Use  . Smoking status: Former Smoker    Packs/day: 0.75    Years: 40.00    Pack years: 30.00    Last attempt to quit: 03/11/2015    Years since quitting: 2.8  . Smokeless tobacco: Never Used  Substance and Sexual Activity  . Alcohol use: No  . Drug use: No  . Sexual activity: Not on file  Lifestyle  . Physical activity:    Days per week: Not on file    Minutes per session: Not on file  . Stress: Not on  file  Relationships  . Social connections:    Talks on phone: Not on file    Gets together: Not on file    Attends religious service: Not on file    Active member of club or organization: Not on file    Attends meetings of clubs or organizations: Not on file    Relationship status: Not on file  . Intimate partner violence:    Fear of current or ex partner: Not on file    Emotionally abused: Not on file    Physically abused: Not on file    Forced sexual activity: Not on file  Other Topics Concern  . Not on file  Social History Narrative  . Not on file    FAMILY HISTORY: Family History  Problem Relation Age of Onset  . Lymphoma Mother   . Heart disease Mother   .  Prostate cancer Father   . Skin cancer Father   . Stroke Father   . Seizures Father   . Epilepsy Brother   . Prostate cancer Brother   . Cancer Maternal Aunt        colonoscopy    ALLERGIES:  is allergic to poison oak extract.  MEDICATIONS:  Current Outpatient Medications  Medication Sig Dispense Refill  . acetaminophen (TYLENOL) 325 MG tablet Take 2 tablets (650 mg total) by mouth every 6 (six) hours as needed for mild pain (or Fever >/= 101).    . calcium carbonate (TUMS - DOSED IN MG ELEMENTAL CALCIUM) 500 MG chewable tablet Chew 1 tablet by mouth every 4 (four) hours as needed for indigestion or heartburn.    . Cholecalciferol (VITAMIN D3) 1000 units CAPS Take 1 capsule by mouth daily.    . insulin glargine (LANTUS) 100 UNIT/ML injection Inject 0.08 mLs (8 Units total) into the skin daily. (Patient taking differently: Inject 20 Units into the skin at bedtime. ) 10 mL 11  . insulin regular (NOVOLIN R,HUMULIN R) 100 units/mL injection Inject 5 Units into the skin 3 (three) times daily before meals. Hold if glucose less than 100.    . levonorgestrel (MIRENA) 20 MCG/24HR IUD 1 Intra Uterine Device (1 each total) by Intrauterine route once for 1 dose. 1 each 0  . Multiple Vitamins-Minerals (MULTIVITAMIN WITH  MINERALS) tablet Take 1 tablet by mouth daily.    Marland Kitchen saccharomyces boulardii (FLORASTOR) 250 MG capsule Take 1 capsule (250 mg total) by mouth 2 (two) times daily.    . traMADol (ULTRAM) 50 MG tablet Take by mouth every 6 (six) hours as needed.     No current facility-administered medications for this visit.     REVIEW OF SYSTEMS:   Constitutional: Denies fevers, chills or abnormal night sweats Ears, nose, mouth, throat, and face: Denies mucositis or sore throat Respiratory: Denies cough, dyspnea or wheezes Cardiovascular: Denies palpitation, chest discomfort  Gastrointestinal:  Denies nausea, heartburn or change in bowel habits Skin: Denies abnormal skin rashes Lymphatics: Denies new lymphadenopathy or easy bruising Behavioral/Psych: Mood is stable, no new changes  All other systems were reviewed with the patient and are negative.  PHYSICAL EXAMINATION: ECOG PERFORMANCE STATUS: 2 - Symptomatic, <50% confined to bed  Vitals:   01/13/18 1210  BP: (!) 163/75  Pulse: 80  Resp: 20  Temp: 98.1 F (36.7 C)  SpO2: 98%   Filed Weights   01/13/18 1210  Weight: 178 lb (80.7 kg)    GENERAL:alert, no distress and comfortable SKIN: skin color, texture, turgor are normal, no rashes or significant lesions EYES: normal, conjunctiva are pink and non-injected, sclera clear OROPHARYNX:no exudate, no erythema and lips, buccal mucosa, and tongue normal  NECK: supple, thyroid normal size, non-tender, without nodularity LYMPH:  no palpable lymphadenopathy in the cervical, axillary or inguinal LUNGS: clear to auscultation and percussion with normal breathing effort HEART: regular rate & rhythm and no murmurs and no lower extremity edema ABDOMEN:abdomen soft, non-tender and normal bowel sounds Musculoskeletal:no cyanosis of digits and no clubbing.  Her right foot is bandaged PSYCH: alert & oriented x 3 with fluent speech NEURO: no focal motor/sensory deficits  LABORATORY DATA:  I have reviewed  the data as listed Lab Results  Component Value Date   WBC 10.1 03/11/2017   HGB 11.6 (L) 03/11/2017   HCT 35.7 (L) 03/11/2017   MCV 86.0 03/11/2017   PLT 403 (H) 03/11/2017   Recent Labs  03/10/17 1805 03/11/17 0342 03/12/17 0300 01/10/18 1257  NA 138 137  --  140  K 4.1 3.7  --  4.4  CL 102 102  --  106  CO2 27 27  --  24  GLUCOSE 121* 106*  --  240*  BUN 27* 21*  --  18  CREATININE 0.84 0.90 0.86 1.22*  CALCIUM 9.6 9.2  --  9.7  GFRNONAA >60 >60 >60 47*  GFRAA >60 >60 >60 54*  PROT 8.2*  --   --   --   ALBUMIN 3.5  --   --   --   AST 19  --   --   --   ALT 12*  --   --   --   ALKPHOS 84  --   --   --   BILITOT 0.7  --   --   --     RADIOGRAPHIC STUDIES: I have personally reviewed the radiological images as listed and agreed with the findings in the report. Ct Abdomen Pelvis W Contrast  Result Date: 01/10/2018 CLINICAL DATA:  Newly diagnosed endometrial carcinoma. Left ovarian mass with elevated CA 125 level. EXAM: CT ABDOMEN AND PELVIS WITH CONTRAST TECHNIQUE: Multidetector CT imaging of the abdomen and pelvis was performed using the standard protocol following bolus administration of intravenous contrast. CONTRAST:  116mL ISOVUE-300 IOPAMIDOL (ISOVUE-300) INJECTION 61% COMPARISON:  None. FINDINGS: Lower Chest: Numerous small pulmonary nodules are seen in both lung bases, consistent with pulmonary metastases. Hepatobiliary: A hypovascular mass is seen in the posterior right hepatic lobe which measures 3.5 x 2.4 cm on image 13/7, highly suspicious for liver metastasis. A soft tissue nodule is also seen along the capsular surface of the left hepatic lobe which measures 1.5 cm on image 26/2, consistent with peritoneal metastasis. Gallbladder is unremarkable. Pancreas:  No mass or inflammatory changes. Spleen: Within normal limits in size and appearance. Adrenals/Urinary Tract: No masses identified. No evidence of hydronephrosis. Unremarkable unopacified urinary bladder.  Stomach/Bowel: No evidence of obstruction, inflammatory process or abnormal fluid collections. Vascular/Lymphatic: Bulky lymphadenopathy is seen in the left pelvis at the iliac bifurcation measuring 3.4 cm in short axis. No other pathologically enlarged lymph nodes identified. Aortic atherosclerosis. Reproductive: Large central uterine mass measuring 6.2 x 5.3 cm, consistent with known endometrial carcinoma. Other: 2 soft tissue nodule seen in the left abdominal omental fat, largest measuring 1.9 cm on image 54/2. No evidence of ascites. Musculoskeletal:  No suspicious bone lesions identified. IMPRESSION: Large central uterine mass, consistent with known endometrial carcinoma. Bulky left iliac lymphadenopathy, consistent with metastatic disease. Left abdominal omental soft tissue nodules and nodule along the capsular surface of the left hepatic lobe, consistent with peritoneal metastases. No evidence of ascites. 3.5 cm right hepatic lobe mass, highly suspicious for liver metastases. Bibasilar pulmonary metastases. Electronically Signed   By: Earle Gell M.D.   On: 01/10/2018 16:53    I spent 55 minutes counseling the patient face to face. The total time spent in the appointment was 60 minutes and more than 50% was on counseling.  All questions were answered. The patient knows to call the clinic with any problems, questions or concerns.  Heath Lark, MD 01/13/2018 1:42 PM

## 2018-01-13 NOTE — Assessment & Plan Note (Signed)
She is not symptomatic We will check her liver function test next week

## 2018-01-13 NOTE — Assessment & Plan Note (Signed)
The patient is aware she has incurable disease and treatment is strictly palliative. We discussed importance of Advanced Directives and Living will. She has a DNR order at home Her son, [name] is the dedicated medical healthcare power of attorney

## 2018-01-13 NOTE — Assessment & Plan Note (Signed)
The patient showed me numerous pictures regarding the diabetic ulcer on the right foot It is slowly improving I would defer to wound care team for further management

## 2018-01-13 NOTE — Progress Notes (Signed)
Follow-up Note: Gyn-Onc  Consult was requested by Dr. Deatra Ina for the evaluation of Brenda Moore 62 y.o. female  CC:  Chief Complaint  Patient presents with  . Endometrial cancer [facility])    Assessment/Plan:  Brenda Moore  is a 62 y.o.  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  I am recommending primary chemotherapy to control disease at the distant sites. If she has distant control (complete resolution of metastatic disease on imaging), we could consider a palliative hysterectomy in the future to control bleeding symptoms. However, it is not indicated at present.  I discussed the nature of her disease, its poor prognosis and the low liklihood of cure.   She will see Dr Alvy Bimler to discuss chemotherapy with platinum and taxane (at her discretion).  HPI: Brenda Moore is a 62 year old P2 who was seen in consultation at the request of Dr Deatra Ina for grade 1 endometrial cancer and a left ovarian cystic complex mass.  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 After 1 year of aggressive wound care, she has demonstrated enough improvement that discharge is planned for 12/30/17 where she will return home with her parents for outpatient management. She is colonized with VRE.   She was diagnosed with diabetes mellitus in 2018 at the time of the diagnosis of gangrene of her foot. Her diabetes is complicated by diabetic neuropathy and vasculopathy.  She denies coronary or cerebrovascular disease (or symptoms of this). She denies retinopathy or nephropathy.  She began experiencing intermittent vaginal spotting in May, 2019. This was initially treated with antibiotics for a UTI, however, she when it persisted she was seen by Dr Deatra Ina on 12/02/17 and an TVUS was performed which revealed uterus measuring  9x6.4x6cm with 3cm endometrium. Right ovary measures 2cm (normal), left ovary measures 4.1x3x3.2cm with increased volume and increased echogenicity and central complex area with no fluid in CDS. An endometrial pipelle biopsy was performed the same day which revealed grade 1 endometrioid endometrial cancer.  She has had no prior surgeries. She has a family history of a brother and father with prostate cancer and a mother with lymphoma. She has had 2 prior vaginal deliveries. She has no history of HRT use.    Interval Hx: Due to the finding of an ovarian cyst on preop workup, she received a CA 125 lab draw on 12/29/17 which was elevated to 50.  This prompted a CT abdo/pelvis on 01/09/18 which showed a large central uterine mass, consistent with known endometrial carcinoma. Bulky left iliac lymphadenopathy, consistent with metastatic disease. Left abdominal omental soft tissue nodules and nodule along the capsular surface of the left hepatic lobe, consistent with peritoneal metastases. No evidence of ascites. 3.5 cm right hepatic lobe mass, highly suspicious for liver metastases. Bibasilar pulmonary metastases.   Current Meds:  Outpatient Encounter Medications as of 01/13/2018  Medication Sig  . acetaminophen (TYLENOL) 325 MG tablet Take 2 tablets (650 mg total) by mouth every 6 (six) hours as needed for mild pain (or Fever >/= 101).  . calcium carbonate (TUMS - DOSED IN MG ELEMENTAL CALCIUM) 500 MG chewable tablet Chew 1 tablet by mouth every 4 (four) hours as needed for indigestion or heartburn.  . Cholecalciferol (VITAMIN D3) 1000 units CAPS Take 1 capsule by mouth daily.  . insulin glargine (LANTUS) 100 UNIT/ML injection  Inject 0.08 mLs (8 Units total) into the skin daily. (Patient taking differently: Inject 20 Units into the skin at bedtime. )  . insulin regular (NOVOLIN R,HUMULIN R) 100 units/mL injection Inject 5 Units into the skin 3 (three) times daily before meals. Hold if glucose less than  100.  . levothyroxine (SYNTHROID, LEVOTHROID) 25 MCG tablet Take 25 mcg by mouth daily before breakfast.  . Multiple Vitamins-Minerals (MULTIVITAMIN WITH MINERALS) tablet Take 1 tablet by mouth daily.  Marland Kitchen saccharomyces boulardii (FLORASTOR) 250 MG capsule Take 1 capsule (250 mg total) by mouth 2 (two) times daily.  . traMADol (ULTRAM) 50 MG tablet Take by mouth every 6 (six) hours as needed.  Marland Kitchen levonorgestrel (MIRENA) 20 MCG/24HR IUD 1 Intra Uterine Device (1 each total) by Intrauterine route once for 1 dose.  . [DISCONTINUED] nitrofurantoin, macrocrystal-monohydrate, (MACROBID) 100 MG capsule Take 100 mg by mouth 2 (two) times daily.   No facility-administered encounter medications on file as of 01/13/2018.     Allergy:  Allergies  Allergen Reactions  . Poison Oak Extract Rash    Social Hx:   Social History   Socioeconomic History  . Marital status: Divorced    Spouse name: Not on file  . Number of children: Not on file  . Years of education: Not on file  . Highest education level: Not on file  Occupational History  . Not on file  Social Needs  . Financial resource strain: Not on file  . Food insecurity:    Worry: Not on file    Inability: Not on file  . Transportation needs:    Medical: Not on file    Non-medical: Not on file  Tobacco Use  . Smoking status: Former Smoker    Packs/day: 0.75    Years: 40.00    Pack years: 30.00    Last attempt to quit: 03/11/2015    Years since quitting: 2.8  . Smokeless tobacco: Never Used  Substance and Sexual Activity  . Alcohol use: No  . Drug use: No  . Sexual activity: Not on file  Lifestyle  . Physical activity:    Days per week: Not on file    Minutes per session: Not on file  . Stress: Not on file  Relationships  . Social connections:    Talks on phone: Not on file    Gets together: Not on file    Attends religious service: Not on file    Active member of club or organization: Not on file    Attends meetings of clubs  or organizations: Not on file    Relationship status: Not on file  . Intimate partner violence:    Fear of current or ex partner: Not on file    Emotionally abused: Not on file    Physically abused: Not on file    Forced sexual activity: Not on file  Other Topics Concern  . Not on file  Social History Narrative  . Not on file    Past Surgical Hx:  Past Surgical History:  Procedure Laterality Date  . SKIN GRAFT Left    age 11 , skin graft to left leg due to lawn mower accident  . toe amptuation Left    age 64 - left little toe amputated due to lawn mower accident    Past Medical Hx:  Past Medical History:  Diagnosis Date  . Anemia   . Diabetes mellitus without complication ([place])   . Osteomyelitis of right foot ([place])  Past Gynecological History:  SVD x 2, denies abnormal pap history. No LMP recorded (lmp unknown). Patient is postmenopausal.  Family Hx:  Family History  Problem Relation Age of Onset  . Lymphoma Mother   . Heart disease Mother   . Prostate cancer Father   . Skin cancer Father   . Stroke Father   . Seizures Father   . Epilepsy Brother   . Prostate cancer Brother     Review of Systems:  Constitutional  Feels well,    ENT Normal appearing ears and nares bilaterally Skin/Breast  No rash, sores, jaundice, itching, dryness Cardiovascular  No chest pain, shortness of breath, or edema  Pulmonary  No cough or wheeze.  Gastro Intestinal  No nausea, vomitting, or diarrhoea. No bright red blood per rectum, no abdominal pain, change in bowel movement, or constipation.  Genito Urinary  No frequency, urgency, dysuria, + postmenopausal bleeding Musculo Skeletal  + unable to walk on right foot due to foot ulcer healing and history of osteomyelitis.  Neurologic  + neuropathy of feet and hands Psychology  No depression, anxiety, insomnia.   Vitals:  Blood pressure (!) 148/77, pulse 80, temperature 98.1 F (36.7 C), temperature source Oral, resp. rate  20, height 5\' 10"  (1.778 m), weight 178 lb (80.7 kg), SpO2 98 %.  Physical Exam: WD in NAD Neck  Supple NROM, without any enlargements.  Lymph Node Survey No cervical supraclavicular or inguinal adenopathy Cardiovascular  Pulse normal rate, regularity and rhythm. S1 and S2 normal.  Lungs  Clear to auscultation bilateraly, without wheezes/crackles/rhonchi. Good air movement.  Skin  No rash/lesions/breakdown  Psychiatry  Alert and oriented to person, place, and time  Abdomen  Normoactive bowel sounds, abdomen soft, non-tender and mildly overweight without evidence of hernia.  Back No CVA tenderness Genito Urinary  Vulva/vagina: Normal external female genitalia.   No lesions. No discharge or bleeding.  Bladder/urethra:  No lesions or masses, well supported bladder  Vagina: blood, no lesions.   Cervix: Normal appearing, no lesions.  Uterus: slightly bulky, mobile, no parametrial involvement or nodularity.  Adnexa: no palpable masses. Rectal  deferred Extremities  No bilateral cyanosis, clubbing or edema.   Thereasa Solo, MD  01/13/2018, 12:01 PM

## 2018-01-13 NOTE — Patient Instructions (Signed)
Dr Alvy Bimler will see you to discuss chemotherapy treatment for your stage 4 endometrial cancer.  You will see Dr Denman George back in the office after you have completed chemotherapy.

## 2018-01-13 NOTE — Assessment & Plan Note (Signed)
She is not symptomatic. Observe for now 

## 2018-01-13 NOTE — Assessment & Plan Note (Signed)
She has severe peripheral neuropathy I will avoid Taxol for now

## 2018-01-19 ENCOUNTER — Other Ambulatory Visit: Payer: Self-pay | Admitting: Oncology

## 2018-01-19 ENCOUNTER — Inpatient Hospital Stay: Payer: Medicaid Other

## 2018-01-19 ENCOUNTER — Other Ambulatory Visit: Payer: Self-pay | Admitting: Hematology and Oncology

## 2018-01-19 ENCOUNTER — Telehealth: Payer: Self-pay | Admitting: Hematology and Oncology

## 2018-01-19 ENCOUNTER — Inpatient Hospital Stay (HOSPITAL_BASED_OUTPATIENT_CLINIC_OR_DEPARTMENT_OTHER): Payer: Medicaid Other | Admitting: Hematology and Oncology

## 2018-01-19 ENCOUNTER — Encounter: Payer: Self-pay | Admitting: Hematology and Oncology

## 2018-01-19 ENCOUNTER — Encounter: Payer: Self-pay | Admitting: Oncology

## 2018-01-19 ENCOUNTER — Inpatient Hospital Stay: Payer: Medicaid Other | Admitting: Hematology and Oncology

## 2018-01-19 VITALS — BP 156/83 | HR 81 | Temp 98.8°F | Resp 18 | Ht 70.0 in | Wt 180.3 lb

## 2018-01-19 DIAGNOSIS — E10621 Type 1 diabetes mellitus with foot ulcer: Secondary | ICD-10-CM

## 2018-01-19 DIAGNOSIS — C541 Malignant neoplasm of endometrium: Secondary | ICD-10-CM

## 2018-01-19 DIAGNOSIS — L97519 Non-pressure chronic ulcer of other part of right foot with unspecified severity: Secondary | ICD-10-CM

## 2018-01-19 DIAGNOSIS — C7800 Secondary malignant neoplasm of unspecified lung: Secondary | ICD-10-CM

## 2018-01-19 DIAGNOSIS — Z8042 Family history of malignant neoplasm of prostate: Secondary | ICD-10-CM

## 2018-01-19 DIAGNOSIS — F17210 Nicotine dependence, cigarettes, uncomplicated: Secondary | ICD-10-CM

## 2018-01-19 DIAGNOSIS — Z807 Family history of other malignant neoplasms of lymphoid, hematopoietic and related tissues: Secondary | ICD-10-CM

## 2018-01-19 DIAGNOSIS — Z87891 Personal history of nicotine dependence: Secondary | ICD-10-CM

## 2018-01-19 DIAGNOSIS — E1042 Type 1 diabetes mellitus with diabetic polyneuropathy: Secondary | ICD-10-CM

## 2018-01-19 DIAGNOSIS — C787 Secondary malignant neoplasm of liver and intrahepatic bile duct: Secondary | ICD-10-CM

## 2018-01-19 DIAGNOSIS — Z794 Long term (current) use of insulin: Secondary | ICD-10-CM

## 2018-01-19 DIAGNOSIS — Z5111 Encounter for antineoplastic chemotherapy: Secondary | ICD-10-CM | POA: Diagnosis not present

## 2018-01-19 DIAGNOSIS — Z803 Family history of malignant neoplasm of breast: Secondary | ICD-10-CM

## 2018-01-19 DIAGNOSIS — E11621 Type 2 diabetes mellitus with foot ulcer: Secondary | ICD-10-CM

## 2018-01-19 DIAGNOSIS — Z7189 Other specified counseling: Secondary | ICD-10-CM

## 2018-01-19 LAB — CBC WITH DIFFERENTIAL/PLATELET
Basophils Absolute: 0.2 10*3/uL — ABNORMAL HIGH (ref 0.0–0.1)
Basophils Relative: 1 %
EOS PCT: 3 %
Eosinophils Absolute: 0.4 10*3/uL (ref 0.0–0.5)
HCT: 37.6 % (ref 34.8–46.6)
HEMOGLOBIN: 12.6 g/dL (ref 11.6–15.9)
LYMPHS ABS: 2.6 10*3/uL (ref 0.9–3.3)
LYMPHS PCT: 23 %
MCH: 29.4 pg (ref 25.1–34.0)
MCHC: 33.5 g/dL (ref 31.5–36.0)
MCV: 87.8 fL (ref 79.5–101.0)
Monocytes Absolute: 0.7 10*3/uL (ref 0.1–0.9)
Monocytes Relative: 6 %
Neutro Abs: 7.6 10*3/uL — ABNORMAL HIGH (ref 1.5–6.5)
Neutrophils Relative %: 67 %
Platelets: 327 10*3/uL (ref 145–400)
RBC: 4.28 MIL/uL (ref 3.70–5.45)
RDW: 14.3 % (ref 11.2–14.5)
WBC: 11.6 10*3/uL — ABNORMAL HIGH (ref 3.9–10.3)

## 2018-01-19 LAB — COMPREHENSIVE METABOLIC PANEL
ALT: 13 U/L (ref 0–44)
AST: 12 U/L — AB (ref 15–41)
Albumin: 3.5 g/dL (ref 3.5–5.0)
Alkaline Phosphatase: 91 U/L (ref 38–126)
Anion gap: 8 (ref 5–15)
BUN: 23 mg/dL (ref 8–23)
CHLORIDE: 104 mmol/L (ref 98–111)
CO2: 24 mmol/L (ref 22–32)
Calcium: 9.4 mg/dL (ref 8.9–10.3)
Creatinine, Ser: 1.17 mg/dL — ABNORMAL HIGH (ref 0.44–1.00)
GFR calc Af Amer: 57 mL/min — ABNORMAL LOW (ref >60)
GFR, EST NON AFRICAN AMERICAN: 49 mL/min — AB (ref >60)
Glucose, Bld: 337 mg/dL — ABNORMAL HIGH (ref 70–99)
POTASSIUM: 4.2 mmol/L (ref 3.5–5.1)
Sodium: 136 mmol/L (ref 135–145)
Total Bilirubin: 0.5 mg/dL (ref 0.3–1.2)
Total Protein: 7.5 g/dL (ref 6.5–8.1)

## 2018-01-19 LAB — GLUCOSE, RANDOM: Glucose, Bld: 149 mg/dL — ABNORMAL HIGH (ref 70–99)

## 2018-01-19 MED ORDER — SODIUM CHLORIDE 0.9 % IV SOLN
446.5000 mg | Freq: Once | INTRAVENOUS | Status: AC
  Administered 2018-01-19: 450 mg via INTRAVENOUS
  Filled 2018-01-19: qty 45

## 2018-01-19 MED ORDER — PALONOSETRON HCL INJECTION 0.25 MG/5ML
INTRAVENOUS | Status: AC
  Filled 2018-01-19: qty 5

## 2018-01-19 MED ORDER — PROCHLORPERAZINE MALEATE 10 MG PO TABS: 10.0000 mg | ORAL_TABLET | Freq: Four times a day (QID) | ORAL | 0 refills | Status: DC | PRN

## 2018-01-19 MED ORDER — SODIUM CHLORIDE 0.9 % IV SOLN: Freq: Once | INTRAVENOUS | Status: DC

## 2018-01-19 MED ORDER — ONDANSETRON HCL 8 MG PO TABS: 8.0000 mg | ORAL_TABLET | Freq: Three times a day (TID) | ORAL | 3 refills | Status: DC | PRN

## 2018-01-19 MED ORDER — PALONOSETRON HCL INJECTION 0.25 MG/5ML
0.2500 mg | Freq: Once | INTRAVENOUS | Status: AC
  Administered 2018-01-19: 0.25 mg via INTRAVENOUS

## 2018-01-19 MED ORDER — ONDANSETRON HCL 8 MG PO TABS: 8.0000 mg | ORAL_TABLET | Freq: Three times a day (TID) | ORAL | 3 refills | Status: AC | PRN

## 2018-01-19 MED ORDER — SODIUM CHLORIDE 0.9 % IV SOLN
Freq: Once | INTRAVENOUS | Status: AC
  Administered 2018-01-19: 15:00:00 via INTRAVENOUS
  Filled 2018-01-19: qty 5

## 2018-01-19 MED ORDER — PROCHLORPERAZINE MALEATE 10 MG PO TABS: 10.0000 mg | ORAL_TABLET | Freq: Four times a day (QID) | ORAL | 0 refills | Status: AC | PRN

## 2018-01-19 MED FILL — PROCHLORPERAZINE 10 MG TAB: 10 | 7 days supply | Qty: 30 | Fill #0

## 2018-01-19 MED FILL — ONDANSETRON HCL 8 MG TABLET: 8 | 10 days supply | Qty: 30 | Fill #0

## 2018-01-19 NOTE — Assessment & Plan Note (Signed)
The patient is aware she has incurable disease and treatment is strictly palliative. We discussed importance of Advanced Directives and Living will. She has a DNR order at home Her son, Tammi Boulier is the dedicated medical healthcare power of attorney

## 2018-01-19 NOTE — Assessment & Plan Note (Addendum)
She has severe peripheral neuropathy I will avoid Taxol for now She is made aware that sugar control might be worse during chemotherapy and she is advised to check her blood sugar carefully I plan to recheck serum glucose again due to high blood sugar detected this morning and she will get insulin as needed

## 2018-01-19 NOTE — Patient Instructions (Signed)
[facility] Discharge Instructions for Patients Receiving Chemotherapy  Today you received the following chemotherapy agents: Carboplatin.  To help prevent nausea and vomiting after your treatment, we encourage you to take your nausea medication as prescribed.   If you develop nausea and vomiting that is not controlled by your nausea medication, call the clinic.   BELOW ARE SYMPTOMS THAT SHOULD BE REPORTED IMMEDIATELY:  *FEVER GREATER THAN 100.5 F  *CHILLS WITH OR WITHOUT FEVER  NAUSEA AND VOMITING THAT IS NOT CONTROLLED WITH YOUR NAUSEA MEDICATION  *UNUSUAL SHORTNESS OF BREATH  *UNUSUAL BRUISING OR BLEEDING  TENDERNESS IN MOUTH AND THROAT WITH OR WITHOUT PRESENCE OF ULCERS  *URINARY PROBLEMS  *BOWEL PROBLEMS  UNUSUAL RASH Items with * indicate a potential emergency and should be followed up as soon as possible.  Feel free to call the clinic should you have any questions or concerns. The clinic phone number is [phone].  Please show the CHEMO ALERT CARD at check-in to the Emergency Department and triage nurse.   Carboplatin injection What is this medicine? CARBOPLATIN (KAR boe pla tin) is a chemotherapy drug. It targets fast dividing cells, like cancer cells, and causes these cells to die. This medicine is used to treat ovarian cancer and many other cancers. This medicine may be used for other purposes; ask your health care provider or pharmacist if you have questions. COMMON BRAND NAME(S): Paraplatin What should I tell my health care provider before I take this medicine? They need to know if you have any of these conditions: -blood disorders -hearing problems -kidney disease -recent or ongoing radiation therapy -an unusual or allergic reaction to carboplatin, cisplatin, other chemotherapy, other medicines, foods, dyes, or preservatives -pregnant or trying to get pregnant -breast-feeding How should I use this medicine? This drug is usually given  as an infusion into a vein. It is administered in a hospital or clinic by a specially trained health care professional. Talk to your pediatrician regarding the use of this medicine in children. Special care may be needed. Overdosage: If you think you have taken too much of this medicine contact a poison control center or emergency room at once. NOTE: This medicine is only for you. Do not share this medicine with others. What if I miss a dose? It is important not to miss a dose. Call your doctor or health care professional if you are unable to keep an appointment. What may interact with this medicine? -medicines for seizures -medicines to increase blood counts like filgrastim, pegfilgrastim, sargramostim -some antibiotics like amikacin, gentamicin, neomycin, streptomycin, tobramycin -vaccines Talk to your doctor or health care professional before taking any of these medicines: -acetaminophen -aspirin -ibuprofen -ketoprofen -naproxen This list may not describe all possible interactions. Give your health care provider a list of all the medicines, herbs, non-prescription drugs, or dietary supplements you use. Also tell them if you smoke, drink alcohol, or use illegal drugs. Some items may interact with your medicine. What should I watch for while using this medicine? Your condition will be monitored carefully while you are receiving this medicine. You will need important blood work done while you are taking this medicine. This drug may make you feel generally unwell. This is not uncommon, as chemotherapy can affect healthy cells as well as cancer cells. Report any side effects. Continue your course of treatment even though you feel ill unless your doctor tells you to stop. In some cases, you may be given additional medicines to help with side effects. Follow   all directions for their use. Call your doctor or health care professional for advice if you get a fever, chills or sore throat, or other  symptoms of a cold or flu. Do not treat yourself. This drug decreases your body's ability to fight infections. Try to avoid being around people who are sick. This medicine may increase your risk to bruise or bleed. Call your doctor or health care professional if you notice any unusual bleeding. Be careful brushing and flossing your teeth or using a toothpick because you may get an infection or bleed more easily. If you have any dental work done, tell your dentist you are receiving this medicine. Avoid taking products that contain aspirin, acetaminophen, ibuprofen, naproxen, or ketoprofen unless instructed by your doctor. These medicines may hide a fever. Do not become pregnant while taking this medicine. Women should inform their doctor if they wish to become pregnant or think they might be pregnant. There is a potential for serious side effects to an unborn child. Talk to your health care professional or pharmacist for more information. Do not breast-feed an infant while taking this medicine. What side effects may I notice from receiving this medicine? Side effects that you should report to your doctor or health care professional as soon as possible: -allergic reactions like skin rash, itching or hives, swelling of the face, lips, or tongue -signs of infection - fever or chills, cough, sore throat, pain or difficulty passing urine -signs of decreased platelets or bleeding - bruising, pinpoint red spots on the skin, black, tarry stools, nosebleeds -signs of decreased red blood cells - unusually weak or tired, fainting spells, lightheadedness -breathing problems -changes in hearing -changes in vision -chest pain -high blood pressure -low blood counts - This drug may decrease the number of white blood cells, red blood cells and platelets. You may be at increased risk for infections and bleeding. -nausea and vomiting -pain, swelling, redness or irritation at the injection site -pain, tingling,  numbness in the hands or feet -problems with balance, talking, walking -trouble passing urine or change in the amount of urine Side effects that usually do not require medical attention (report to your doctor or health care professional if they continue or are bothersome): -hair loss -loss of appetite -metallic taste in the mouth or changes in taste This list may not describe all possible side effects. Call your doctor for medical advice about side effects. You may report side effects to FDA at 1-800-FDA-1088. Where should I keep my medicine? This drug is given in a hospital or clinic and will not be stored at home. NOTE: This sheet is a summary. It may not cover all possible information. If you have questions about this medicine, talk to your doctor, pharmacist, or health care provider.  2018 Elsevier/Gold Standard (2007-10-17 14:38:05)  

## 2018-01-19 NOTE — Progress Notes (Signed)
[facility] OFFICE PROGRESS NOTE  Brenda Moore Care Team: Brenda Moore, No Pcp Per as PCP - General (General Practice)  ASSESSMENT & PLAN:  Endometrial cancer ([place]) We discussed the plan of care The Brenda Moore has advanced metastatic endometrial cancer Treatment is strictly palliative She has significant medical comorbidities with poorly controlled diabetes with associated peripheral neuropathy and nonhealing wound I recommend single agent carboplatin every 3 weeks for minimum 3 cycles before repeat imaging study for assessment of response to treatment If she tolerated treatment well and her medical comorbidities improve, we can consider the addition of Taxol in the future Due to risk of infection, I do not recommend port placement We discussed some of the risks, benefits, side-effects of carboplatin  Some of the short term side-effects included, though not limited to, including weight loss, life threatening infections, risk of allergic reactions, need for transfusions of blood products, nausea, vomiting, change in bowel habits, loss of hair, admission to hospital for various reasons, and risks of death.   Long term side-effects are also discussed including risks of infertility, permanent damage to nerve function, hearing loss, chronic fatigue, kidney damage with possibility needing hemodialysis, and rare secondary malignancy including bone marrow disorders.  The Brenda Moore is aware that the response rates discussed earlier is not guaranteed.  After a long discussion, Brenda Moore made an informed decision to proceed with the prescribed plan of care.   Brenda Moore education material was dispensed. I will see her back in 3 weeks for toxicity review   Metastasis to liver [facility]) She is not symptomatic Liver function test is stable  Metastasis to lung [facility]) She is not symptomatic Observe for now  Diabetic ulcer of toe of right foot ([place]) According to the Brenda Moore, the diabetic foot ulcer is slowly  improving I would defer to wound care team for further management  Diabetic neuropathy associated with type 1 diabetes mellitus ([place]) She has severe peripheral neuropathy I will avoid Taxol for now She is made aware that sugar control might be worse during chemotherapy and she is advised to check her blood sugar carefully I plan to recheck serum glucose again due to high blood sugar detected this morning and she will get insulin as needed  Goals of care, counseling/discussion The Brenda Moore is aware she has incurable disease and treatment is strictly palliative. We discussed importance of Advanced Directives and Living will. She has a DNR order at home Her son, Lillyrose Reitan is the dedicated medical healthcare power of attorney   Orders Placed This Encounter  Procedures  . Glucose, random    Standing Status:   Future    Number of Occurrences:   1    Standing Expiration Date:   01/19/2019    INTERVAL HISTORY: Please see below for problem oriented charting. She returns for further follow-up She feels well She stated that her toe is healing well She denies pain Denies abdominal bloating, nausea, changes in bowel habits or vaginal bleeding She is ready to start chemotherapy  SUMMARY OF ONCOLOGIC HISTORY: Oncology History   Endometrioid with squamous metaplasia     Endometrial cancer ([place])   12/22/2017 Pathology Results    Endometrial biopsy by Dr. Deatra Ina showed well differentiated endometrioid adenocarcinoma with squamous metaplasia (FIGO Grade I).      12/29/2017 Tumor Marker    Brenda Moore's tumor was tested for the following markers: CA-125 Results of the tumor marker test revealed 50.5      01/10/2018 Imaging    Large central uterine mass, consistent with  known endometrial carcinoma.  Bulky left iliac lymphadenopathy, consistent with metastatic disease.  Left abdominal omental soft tissue nodules and nodule along the capsular surface of the left hepatic lobe, consistent with  peritoneal metastases. No evidence of ascites.  3.5 cm right hepatic lobe mass, highly suspicious for liver metastases.  Bibasilar pulmonary metastases.       01/13/2018 Cancer Staging    Staging form: Corpus Uteri - Carcinoma and Carcinosarcoma, AJCC 8th Edition - Clinical: FIGO Stage IVB (cT3, cN2, cM1) - Signed by Heath Lark, MD on 01/13/2018       Metastasis to lung ([place])   01/13/2018 Initial Diagnosis    Metastasis to lung [facility])       Metastasis to liver ([place])   01/13/2018 Initial Diagnosis    Metastasis to liver ([facility])       REVIEW OF SYSTEMS:   Constitutional: Denies fevers, chills or abnormal weight loss Eyes: Denies blurriness of vision Ears, nose, mouth, throat, and face: Denies mucositis or sore throat Respiratory: Denies cough, dyspnea or wheezes Cardiovascular: Denies palpitation, chest discomfort or lower extremity swelling Gastrointestinal:  Denies nausea, heartburn or change in bowel habits Skin: Denies abnormal skin rashes Lymphatics: Denies new lymphadenopathy or easy bruising Neurological:Denies numbness, tingling or new weaknesses Behavioral/Psych: Mood is stable, no new changes  All other systems were reviewed with the Brenda Moore and are negative.  I have reviewed the past medical history, past surgical history, social history and family history with the Brenda Moore and they are unchanged from previous note.  ALLERGIES:  is allergic to poison oak extract.  MEDICATIONS:  Current Outpatient Medications  Medication Sig Dispense Refill  . acetaminophen (TYLENOL) 325 MG tablet Take 2 tablets (650 mg total) by mouth every 6 (six) hours as needed for mild pain (or Fever >/= 101).    . calcium carbonate (TUMS - DOSED IN MG ELEMENTAL CALCIUM) 500 MG chewable tablet Chew 1 tablet by mouth every 4 (four) hours as needed for indigestion or heartburn.    . Cholecalciferol (VITAMIN D3) 1000 units CAPS Take 1 capsule by mouth daily.    . insulin glargine (LANTUS) 100  UNIT/ML injection Inject 0.08 mLs (8 Units total) into the skin daily. (Brenda Moore taking differently: Inject 20 Units into the skin at bedtime. ) 10 mL 11  . insulin regular (NOVOLIN R,HUMULIN R) 100 units/mL injection Inject 5 Units into the skin 3 (three) times daily before meals. Hold if glucose less than 100.    . levonorgestrel (MIRENA) 20 MCG/24HR IUD 1 Intra Uterine Device (1 each total) by Intrauterine route once for 1 dose. 1 each 0  . Multiple Vitamins-Minerals (MULTIVITAMIN WITH MINERALS) tablet Take 1 tablet by mouth daily.    . ondansetron (ZOFRAN) 8 MG tablet Take 1 tablet (8 mg total) by mouth every 8 (eight) hours as needed for nausea. 30 tablet 3  . prochlorperazine (COMPAZINE) 10 MG tablet Take 1 tablet (10 mg total) by mouth every 6 (six) hours as needed for nausea or vomiting. 30 tablet 0  . saccharomyces boulardii (FLORASTOR) 250 MG capsule Take 1 capsule (250 mg total) by mouth 2 (two) times daily.    . traMADol (ULTRAM) 50 MG tablet Take by mouth every 6 (six) hours as needed.     No current facility-administered medications for this visit.     PHYSICAL EXAMINATION: ECOG PERFORMANCE STATUS: 1 - Symptomatic but completely ambulatory  Vitals:   01/19/18 1346  BP: (!) 156/83  Pulse: 81  Resp: 18  Temp: 98.8  F (37.1 C)  SpO2: 99%   Filed Weights   01/19/18 1346  Weight: 180 lb 4.8 oz (81.8 kg)    GENERAL:alert, no distress and comfortable SKIN: skin color, texture, turgor are normal, no rashes or significant lesions EYES: normal, Conjunctiva are pink and non-injected, sclera clear OROPHARYNX:no exudate, no erythema and lips, buccal mucosa, and tongue normal  NECK: supple, thyroid normal size, non-tender, without nodularity LYMPH:  no palpable lymphadenopathy in the cervical, axillary or inguinal LUNGS: clear to auscultation and percussion with normal breathing effort HEART: regular rate & rhythm and no murmurs and no lower extremity edema.  Her lower extremity is  not examined ABDOMEN:abdomen soft, non-tender and normal bowel sounds Musculoskeletal:no cyanosis of digits and no clubbing  NEURO: alert & oriented x 3 with fluent speech, no focal motor/sensory deficits  LABORATORY DATA:  I have reviewed the data as listed    Component Value Date/Time   NA 136 01/19/2018 1101   K 4.2 01/19/2018 1101   CL 104 01/19/2018 1101   CO2 24 01/19/2018 1101   GLUCOSE 337 (H) 01/19/2018 1101   BUN 23 01/19/2018 1101   CREATININE 1.17 (H) 01/19/2018 1101   CALCIUM 9.4 01/19/2018 1101   PROT 7.5 01/19/2018 1101   ALBUMIN 3.5 01/19/2018 1101   AST 12 (L) 01/19/2018 1101   ALT 13 01/19/2018 1101   ALKPHOS 91 01/19/2018 1101   BILITOT 0.5 01/19/2018 1101   GFRNONAA 49 (L) 01/19/2018 1101   GFRAA 57 (L) 01/19/2018 1101    No results found for: SPEP, UPEP  Lab Results  Component Value Date   WBC 11.6 (H) 01/19/2018   NEUTROABS 7.6 (H) 01/19/2018   HGB 12.6 01/19/2018   HCT 37.6 01/19/2018   MCV 87.8 01/19/2018   PLT 327 01/19/2018      Chemistry      Component Value Date/Time   NA 136 01/19/2018 1101   K 4.2 01/19/2018 1101   CL 104 01/19/2018 1101   CO2 24 01/19/2018 1101   BUN 23 01/19/2018 1101   CREATININE 1.17 (H) 01/19/2018 1101      Component Value Date/Time   CALCIUM 9.4 01/19/2018 1101   ALKPHOS 91 01/19/2018 1101   AST 12 (L) 01/19/2018 1101   ALT 13 01/19/2018 1101   BILITOT 0.5 01/19/2018 1101       RADIOGRAPHIC STUDIES: I have personally reviewed the radiological images as listed and agreed with the findings in the report. Ct Abdomen Pelvis W Contrast  Result Date: 01/10/2018 CLINICAL DATA:  Newly diagnosed endometrial carcinoma. Left ovarian mass with elevated CA 125 level. EXAM: CT ABDOMEN AND PELVIS WITH CONTRAST TECHNIQUE: Multidetector CT imaging of the abdomen and pelvis was performed using the standard protocol following bolus administration of intravenous contrast. CONTRAST:  13mL ISOVUE-300 IOPAMIDOL (ISOVUE-300)  INJECTION 61% COMPARISON:  None. FINDINGS: Lower Chest: Numerous small pulmonary nodules are seen in both lung bases, consistent with pulmonary metastases. Hepatobiliary: A hypovascular mass is seen in the posterior right hepatic lobe which measures 3.5 x 2.4 cm on image 13/7, highly suspicious for liver metastasis. A soft tissue nodule is also seen along the capsular surface of the left hepatic lobe which measures 1.5 cm on image 26/2, consistent with peritoneal metastasis. Gallbladder is unremarkable. Pancreas:  No mass or inflammatory changes. Spleen: Within normal limits in size and appearance. Adrenals/Urinary Tract: No masses identified. No evidence of hydronephrosis. Unremarkable unopacified urinary bladder. Stomach/Bowel: No evidence of obstruction, inflammatory process or abnormal fluid collections. Vascular/Lymphatic: Bulky  lymphadenopathy is seen in the left pelvis at the iliac bifurcation measuring 3.4 cm in short axis. No other pathologically enlarged lymph nodes identified. Aortic atherosclerosis. Reproductive: Large central uterine mass measuring 6.2 x 5.3 cm, consistent with known endometrial carcinoma. Other: 2 soft tissue nodule seen in the left abdominal omental fat, largest measuring 1.9 cm on image 54/2. No evidence of ascites. Musculoskeletal:  No suspicious bone lesions identified. IMPRESSION: Large central uterine mass, consistent with known endometrial carcinoma. Bulky left iliac lymphadenopathy, consistent with metastatic disease. Left abdominal omental soft tissue nodules and nodule along the capsular surface of the left hepatic lobe, consistent with peritoneal metastases. No evidence of ascites. 3.5 cm right hepatic lobe mass, highly suspicious for liver metastases. Bibasilar pulmonary metastases. Electronically Signed   By: Earle Gell M.D.   On: 01/10/2018 16:53    All questions were answered. The Brenda Moore knows to call the clinic with any problems, questions or concerns. No barriers  to learning was detected.  I spent 20 minutes counseling the Brenda Moore face to face. The total time spent in the appointment was 30 minutes and more than 50% was on counseling and review of test results  Heath Lark, MD 01/19/2018 2:03 PM

## 2018-01-19 NOTE — Telephone Encounter (Signed)
Per 6/27 los, no new orders.

## 2018-01-19 NOTE — Assessment & Plan Note (Signed)
We discussed the plan of care The patient has advanced metastatic endometrial cancer Treatment is strictly palliative She has significant medical comorbidities with poorly controlled diabetes with associated peripheral neuropathy and nonhealing wound I recommend single agent carboplatin every 3 weeks for minimum 3 cycles before repeat imaging study for assessment of response to treatment If she tolerated treatment well and her medical comorbidities improve, we can consider the addition of Taxol in the future Due to risk of infection, I do not recommend port placement We discussed some of the risks, benefits, side-effects of carboplatin  Some of the short term side-effects included, though not limited to, including weight loss, life threatening infections, risk of allergic reactions, need for transfusions of blood products, nausea, vomiting, change in bowel habits, loss of hair, admission to hospital for various reasons, and risks of death.   Long term side-effects are also discussed including risks of infertility, permanent damage to nerve function, hearing loss, chronic fatigue, kidney damage with possibility needing hemodialysis, and rare secondary malignancy including bone marrow disorders.  The patient is aware that the response rates discussed earlier is not guaranteed.  After a long discussion, patient made an informed decision to proceed with the prescribed plan of care.   Patient education material was dispensed. I will see her back in 3 weeks for toxicity review

## 2018-01-19 NOTE — Assessment & Plan Note (Signed)
She is not symptomatic Liver function test is stable

## 2018-01-19 NOTE — Progress Notes (Signed)
Received voicemail from [employer] regarding patient needing assistance with medications.  Went to infusion to meet with patient. She states her Medicaid starts 01/23/18 and received prescriptions today(e-scribed). Advised patient that she would need to provide proof of income. Patient was able to log on and show me her statement but was not able to email it. With the help of an RN, she was able to have a hard copy printed.  Patient approved for one-time $400 grant to help with this concern as well as other personal expenses. A copy of the approval as well as expense sheet was given along with our outpatient pharmacy information. Raynaldo Opitz RN that patient was approved and had concerns about getting prescriptions being that they must be filled at outpatient pharamacy. Brenda Moore states she would transfer to the outpatient pharmacy and she confirmed with patient as well. Patient has my card for any additional financial questions or concerns. She was very [employer] attorney.

## 2018-01-19 NOTE — Assessment & Plan Note (Signed)
She is not symptomatic. Observe for now 

## 2018-01-19 NOTE — Assessment & Plan Note (Signed)
According to the patient, the diabetic foot ulcer is slowly improving I would defer to wound care team for further management

## 2018-01-20 LAB — CA 125: Cancer Antigen (CA) 125: 38.9 U/mL — ABNORMAL HIGH (ref 0.0–38.1)

## 2018-01-27 ENCOUNTER — Telehealth: Payer: Self-pay | Admitting: *Deleted

## 2018-01-27 ENCOUNTER — Other Ambulatory Visit: Payer: Self-pay | Admitting: Hematology and Oncology

## 2018-01-27 MED ORDER — TRAMADOL HCL 50 MG PO TABS: 50.0000 mg | ORAL_TABLET | Freq: Four times a day (QID) | ORAL | 0 refills | Status: AC | PRN

## 2018-01-27 NOTE — Telephone Encounter (Signed)
Tramadol prescription phoned into [facility].  Instructed patient that she needs to follow up with [facility] PCP for refills while she is trying to get established in [employer]. Verbalized understanding

## 2018-01-27 NOTE — Telephone Encounter (Signed)
Pt states she needs refills:  Insulin syringes Humulin R Lantus Tramadol Prostat (not on med list) states she takes this high protein medicine for her foot.  Does not have a PCP as she was moved from [facility] to [facility] Was seeing PA at [facility] in [place]. Is living with parents in [place].

## 2018-01-30 ENCOUNTER — Telehealth: Payer: Self-pay | Admitting: Oncology

## 2018-01-30 NOTE — Telephone Encounter (Signed)
Called Brenda Moore and discussed finding a primary care provider. [name] said the provider needs to be in [place] for her medicaid.  She said she is going to call to find a PCP today.  Asked if she would like assistance with this and she said no.  She was given my phone number and was advised her to call if she needs anything.

## 2018-01-31 ENCOUNTER — Telehealth: Payer: Self-pay | Admitting: Oncology

## 2018-01-31 NOTE — Telephone Encounter (Signed)
Brenda Moore called and said she has found a PCP but they want to see her on 02/10/18 which is a day after her next chemotherapy appointment and she is worried that she won't be feeling well enough to go.  She asked if she should try to move her chemo appointment.  Advised her that it is better to try an move the PCP appointment and asked if she would like me to call the office to try and move it up.  She said she will call them and will call back if she needs help.

## 2018-02-01 ENCOUNTER — Ambulatory Visit: Payer: Self-pay | Admitting: Gynecologic Oncology

## 2018-02-09 ENCOUNTER — Inpatient Hospital Stay: Payer: Medicaid Other

## 2018-02-09 ENCOUNTER — Encounter: Payer: Self-pay | Admitting: Oncology

## 2018-02-09 ENCOUNTER — Inpatient Hospital Stay (HOSPITAL_BASED_OUTPATIENT_CLINIC_OR_DEPARTMENT_OTHER): Payer: Medicaid Other | Admitting: Hematology and Oncology

## 2018-02-09 ENCOUNTER — Inpatient Hospital Stay: Payer: Medicaid Other | Attending: Gynecologic Oncology

## 2018-02-09 ENCOUNTER — Other Ambulatory Visit: Payer: Self-pay

## 2018-02-09 ENCOUNTER — Telehealth: Payer: Self-pay | Admitting: Hematology and Oncology

## 2018-02-09 VITALS — BP 156/76 | HR 90 | Temp 98.4°F | Resp 18 | Ht 70.0 in | Wt 176.7 lb

## 2018-02-09 DIAGNOSIS — C787 Secondary malignant neoplasm of liver and intrahepatic bile duct: Secondary | ICD-10-CM

## 2018-02-09 DIAGNOSIS — C7800 Secondary malignant neoplasm of unspecified lung: Secondary | ICD-10-CM | POA: Diagnosis not present

## 2018-02-09 DIAGNOSIS — C541 Malignant neoplasm of endometrium: Secondary | ICD-10-CM

## 2018-02-09 DIAGNOSIS — E1042 Type 1 diabetes mellitus with diabetic polyneuropathy: Secondary | ICD-10-CM | POA: Diagnosis not present

## 2018-02-09 DIAGNOSIS — Z5111 Encounter for antineoplastic chemotherapy: Secondary | ICD-10-CM | POA: Insufficient documentation

## 2018-02-09 DIAGNOSIS — E11621 Type 2 diabetes mellitus with foot ulcer: Secondary | ICD-10-CM

## 2018-02-09 DIAGNOSIS — L97519 Non-pressure chronic ulcer of other part of right foot with unspecified severity: Secondary | ICD-10-CM

## 2018-02-09 LAB — COMPREHENSIVE METABOLIC PANEL
ALBUMIN: 3.4 g/dL — AB (ref 3.5–5.0)
ALK PHOS: 113 U/L (ref 38–126)
ALT: 11 U/L (ref 0–44)
ANION GAP: 10 (ref 5–15)
AST: 9 U/L — ABNORMAL LOW (ref 15–41)
BUN: 19 mg/dL (ref 8–23)
CO2: 24 mmol/L (ref 22–32)
CREATININE: 1.22 mg/dL — AB (ref 0.44–1.00)
Calcium: 9.8 mg/dL (ref 8.9–10.3)
Chloride: 100 mmol/L (ref 98–111)
GFR calc Af Amer: 54 mL/min — ABNORMAL LOW (ref >60)
GFR calc non Af Amer: 47 mL/min — ABNORMAL LOW (ref >60)
GLUCOSE: 345 mg/dL — AB (ref 70–99)
Potassium: 4.5 mmol/L (ref 3.5–5.1)
SODIUM: 134 mmol/L — AB (ref 135–145)
Total Bilirubin: 0.4 mg/dL (ref 0.3–1.2)
Total Protein: 8.2 g/dL — ABNORMAL HIGH (ref 6.5–8.1)

## 2018-02-09 LAB — CBC WITH DIFFERENTIAL/PLATELET
Basophils Absolute: 0.1 10*3/uL (ref 0.0–0.1)
Basophils Relative: 1 %
Eosinophils Absolute: 0 10*3/uL (ref 0.0–0.5)
Eosinophils Relative: 0 %
HEMATOCRIT: 37.2 % (ref 34.8–46.6)
HEMOGLOBIN: 12.5 g/dL (ref 11.6–15.9)
LYMPHS ABS: 2.4 10*3/uL (ref 0.9–3.3)
LYMPHS PCT: 24 %
MCH: 29.1 pg (ref 25.1–34.0)
MCHC: 33.6 g/dL (ref 31.5–36.0)
MCV: 86.6 fL (ref 79.5–101.0)
Monocytes Absolute: 0.7 10*3/uL (ref 0.1–0.9)
Monocytes Relative: 7 %
NEUTROS ABS: 6.9 10*3/uL — AB (ref 1.5–6.5)
NEUTROS PCT: 68 %
Platelets: 390 10*3/uL (ref 145–400)
RBC: 4.3 MIL/uL (ref 3.70–5.45)
RDW: 13.8 % (ref 11.2–14.5)
WBC: 10.2 10*3/uL (ref 3.9–10.3)

## 2018-02-09 MED ORDER — NEEDLES & SYRINGES MISC: 1.0000 [IU] | Freq: Every day | 0 refills | Status: DC

## 2018-02-09 MED ORDER — SODIUM CHLORIDE 0.9 % IV SOLN
433.5000 mg | Freq: Once | INTRAVENOUS | Status: AC
  Administered 2018-02-09: 430 mg via INTRAVENOUS
  Filled 2018-02-09: qty 43

## 2018-02-09 MED ORDER — SODIUM CHLORIDE 0.9 % IV SOLN
Freq: Once | INTRAVENOUS | Status: AC
  Administered 2018-02-09: 12:00:00 via INTRAVENOUS

## 2018-02-09 MED ORDER — SODIUM CHLORIDE 0.9 % IV SOLN
Freq: Once | INTRAVENOUS | Status: AC
  Administered 2018-02-09: 12:00:00 via INTRAVENOUS
  Filled 2018-02-09: qty 5

## 2018-02-09 MED ORDER — PALONOSETRON HCL INJECTION 0.25 MG/5ML
INTRAVENOUS | Status: AC
  Filled 2018-02-09: qty 5

## 2018-02-09 MED ORDER — PALONOSETRON HCL INJECTION 0.25 MG/5ML
0.2500 mg | Freq: Once | INTRAVENOUS | Status: AC
  Administered 2018-02-09: 0.25 mg via INTRAVENOUS

## 2018-02-09 MED ORDER — INSULIN REGULAR HUMAN 100 UNIT/ML IJ SOLN: 5.0000 [IU] | Freq: Three times a day (TID) | INTRAMUSCULAR | 3 refills | Status: AC

## 2018-02-09 MED ORDER — INSULIN REGULAR HUMAN 100 UNIT/ML IJ SOLN
10.0000 [IU] | Freq: Once | INTRAMUSCULAR | Status: AC
  Administered 2018-02-09: 10 [IU] via SUBCUTANEOUS
  Filled 2018-02-09: qty 0.1

## 2018-02-09 MED ORDER — INSULIN GLARGINE 100 UNIT/ML ~~LOC~~ SOLN: 20.0000 [IU] | Freq: Every day | SUBCUTANEOUS | 1 refills | Status: AC

## 2018-02-09 NOTE — Progress Notes (Signed)
Dr. Alvy Bimler ok to tx with glucose 345. 10U insulin ordered.  Pt questioned why carboplatin begin given over an hour. Dosage less than last treatment. Spoke with [place], [place]. Carboplatin treatment plan chosen by Dr. Alvy Bimler is set for chemo to run over one hour. Carboplatin was run over an hour last infusion as well. Pt verbalized understanding.

## 2018-02-09 NOTE — Telephone Encounter (Signed)
Gave patient avs and calendar.   °

## 2018-02-09 NOTE — Patient Instructions (Signed)
[facility] Discharge Instructions for Patients Receiving Chemotherapy  Today you received the following chemotherapy agents:  carboplatin (Paraplatin)  To help prevent nausea and vomiting after your treatment, we encourage you to take your nausea medication as prescribed.   If you develop nausea and vomiting that is not controlled by your nausea medication, call the clinic.   BELOW ARE SYMPTOMS THAT SHOULD BE REPORTED IMMEDIATELY:  *FEVER GREATER THAN 100.5 F  *CHILLS WITH OR WITHOUT FEVER  NAUSEA AND VOMITING THAT IS NOT CONTROLLED WITH YOUR NAUSEA MEDICATION  *UNUSUAL SHORTNESS OF BREATH  *UNUSUAL BRUISING OR BLEEDING  TENDERNESS IN MOUTH AND THROAT WITH OR WITHOUT PRESENCE OF ULCERS  *URINARY PROBLEMS  *BOWEL PROBLEMS  UNUSUAL RASH Items with * indicate a potential emergency and should be followed up as soon as possible.  Feel free to call the clinic should you have any questions or concerns. The clinic phone number is [phone].  Please show the CHEMO ALERT CARD at check-in to the Emergency Department and triage nurse.   

## 2018-02-10 ENCOUNTER — Encounter: Payer: Self-pay | Admitting: Hematology and Oncology

## 2018-02-10 ENCOUNTER — Telehealth: Payer: Self-pay | Admitting: Oncology

## 2018-02-10 LAB — CA 125: Cancer Antigen (CA) 125: 45.2 U/mL — ABNORMAL HIGH (ref 0.0–38.1)

## 2018-02-10 NOTE — Assessment & Plan Note (Signed)
She tolerated cycle 1 of chemotherapy well except for severe hypoglycemia which I believe is not related We will proceed with treatment without dose adjustment I recommend minimum 3 cycles of chemotherapy before repeat imaging study for objective assessment of response to therapy I advised the patient to seek primary care doctor for other health related issues.

## 2018-02-10 NOTE — Assessment & Plan Note (Signed)
She is not symptomatic Liver function test is stable

## 2018-02-10 NOTE — Assessment & Plan Note (Addendum)
She has poorly controlled diabetes She was not able to get her medication supplies refilled We discussed the risk and benefits of me refilling her medications on a temporary basis I told the patient I will refill her prescription for short period of time but she needs to get established with a local primary care doctor to manage her diabetes She is accepts the offer from me to manage her diabetes in a short period of time Today, I recommend additional dose of insulin due to severe hyperglycemia noted on her blood work

## 2018-02-10 NOTE — Assessment & Plan Note (Signed)
She is still getting aggressive wound care According to the patient, her foot ulcers healing.

## 2018-02-10 NOTE — Telephone Encounter (Signed)
[employer] called and asked if the grant at [facility] can be transferred [facility] [place].  Called Stefanie Libel, Patient Financial Advocated and asked if this is possible.  She said the grant can only be used at [facility].  Called Teyonna back and let her know.  She verbalized agreement and understanding.

## 2018-02-10 NOTE — Assessment & Plan Note (Signed)
She is not symptomatic. Observe for now 

## 2018-02-10 NOTE — Progress Notes (Signed)
[place] OFFICE PROGRESS NOTE  Patient Care Team: Patient, No Pcp Per as PCP - General (General Practice)  ASSESSMENT & PLAN:  Endometrial cancer (Brenda Moore) She tolerated cycle 1 of chemotherapy well except for severe hypoglycemia which I believe is not related We will proceed with treatment without dose adjustment I recommend minimum 3 cycles of chemotherapy before repeat imaging study for objective assessment of response to therapy I advised the patient to seek primary care doctor for other health related issues.  Metastasis to liver [facility]) She is not symptomatic Liver function test is stable  Metastasis to lung [facility]) She is not symptomatic Observe for now  Diabetic neuropathy associated with type 1 diabetes mellitus (Brenda Moore) She has poorly controlled diabetes She was not able to get her medication supplies refilled We discussed the risk and benefits of me refilling her medications on a temporary basis I told the patient I will refill her prescription for short period of time but she needs to get established with a local primary care doctor to manage her diabetes She is accepts the offer from me to manage her diabetes in a short period of time Today, I recommend additional dose of insulin due to severe hyperglycemia noted on her blood work  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   No orders of the defined types were placed in this encounter.   INTERVAL HISTORY: Please see below for problem oriented charting. She returns for cycle 2 of chemotherapy Her foot ulcer stable She denies worsening peripheral neuropathy from treatment No recent nausea, vomiting, abdominal bloating or changes in bowel habits since treatment No recent fever or chills Recently, she ran out of her prescription for diabetes supplies and her insulin She is not able to get reestablished with a local  primary doctor  SUMMARY OF ONCOLOGIC HISTORY: Oncology History   Endometrioid with squamous metaplasia     Endometrial cancer ([name])   12/22/2017 Pathology Results    Endometrial biopsy by Dr. Deatra Ina showed well differentiated endometrioid adenocarcinoma with squamous metaplasia (FIGO Grade I).      12/29/2017 Tumor Marker    Patient's tumor was tested for the following markers: CA-125 Results of the tumor marker test revealed 50.5      01/10/2018 Imaging    Large central uterine mass, consistent with known endometrial carcinoma.  Bulky left iliac lymphadenopathy, consistent with metastatic disease.  Left abdominal omental soft tissue nodules and nodule along the capsular surface of the left hepatic lobe, consistent with peritoneal metastases. No evidence of ascites.  3.5 cm right hepatic lobe mass, highly suspicious for liver metastases.  Bibasilar pulmonary metastases.       01/13/2018 Cancer Staging    Staging form: Corpus Uteri - Carcinoma and Carcinosarcoma, AJCC 8th Edition - Clinical: FIGO Stage IVB (cT3, cN2, cM1) - Signed by Heath Lark, MD on 01/13/2018      01/19/2018 Tumor Marker    Patient's tumor was tested for the following markers: CA-125 Results of the tumor marker test revealed 38.9      01/19/2018 -  Chemotherapy    The patient had carboplatin      02/09/2018 Tumor Marker    Patient's tumor was tested for the following markers: CA-125 Results of the tumor marker test revealed 45.2       Metastasis to lung ([place])   01/13/2018 Initial Diagnosis    Metastasis to lung ([employer])  Metastasis to liver ([place])   01/13/2018 Initial Diagnosis    Metastasis to liver ([facility])       REVIEW OF SYSTEMS:   Constitutional: Denies fevers, chills or abnormal weight loss Eyes: Denies blurriness of vision Ears, nose, mouth, throat, and face: Denies mucositis or sore throat Respiratory: Denies cough, dyspnea or wheezes Cardiovascular: Denies palpitation, chest  discomfort or lower extremity swelling Gastrointestinal:  Denies nausea, heartburn or change in bowel habits Skin: Denies abnormal skin rashes Lymphatics: Denies new lymphadenopathy or easy bruising Neurological:Denies numbness, tingling or new weaknesses Behavioral/Psych: Mood is stable, no new changes  All other systems were reviewed with the patient and are negative.  I have reviewed the past medical history, past surgical history, social history and family history with the patient and they are unchanged from previous note.  ALLERGIES:  is allergic to poison oak extract.  MEDICATIONS:  Current Outpatient Medications  Medication Sig Dispense Refill  . acetaminophen (TYLENOL) 325 MG tablet Take 2 tablets (650 mg total) by mouth every 6 (six) hours as needed for mild pain (or Fever >/= 101).    . calcium carbonate (TUMS - DOSED IN MG ELEMENTAL CALCIUM) 500 MG chewable tablet Chew 1 tablet by mouth every 4 (four) hours as needed for indigestion or heartburn.    . Cholecalciferol (VITAMIN D3) 1000 units CAPS Take 1 capsule by mouth daily.    . insulin glargine (LANTUS) 100 UNIT/ML injection Inject 0.2 mLs (20 Units total) into the skin at bedtime. 1 vial 1  . insulin regular (NOVOLIN R,HUMULIN R) 100 units/mL injection Inject 0.05 mLs (5 Units total) into the skin 3 (three) times daily before meals. Hold if glucose less than 100. 10 mL 3  . levonorgestrel (MIRENA) 20 MCG/24HR IUD 1 Intra Uterine Device (1 each total) by Intrauterine route once for 1 dose. 1 each 0  . Multiple Vitamins-Minerals (MULTIVITAMIN WITH MINERALS) tablet Take 1 tablet by mouth daily.    . Needles & Syringes MISC 1 Units by Does not apply route daily. 1 each 0  . ondansetron (ZOFRAN) 8 MG tablet Take 1 tablet (8 mg total) by mouth every 8 (eight) hours as needed for nausea. 30 tablet 3  . prochlorperazine (COMPAZINE) 10 MG tablet Take 1 tablet (10 mg total) by mouth every 6 (six) hours as needed for nausea or vomiting.  30 tablet 0  . saccharomyces boulardii (FLORASTOR) 250 MG capsule Take 1 capsule (250 mg total) by mouth 2 (two) times daily.    . traMADol (ULTRAM) 50 MG tablet Take 1 tablet (50 mg total) by mouth every 6 (six) hours as needed. 90 tablet 0   No current facility-administered medications for this visit.     PHYSICAL EXAMINATION: ECOG PERFORMANCE STATUS: 1 - Symptomatic but completely ambulatory  Vitals:   02/09/18 0949  BP: (!) 156/76  Pulse: 90  Resp: 18  Temp: 98.4 F (36.9 C)  SpO2: 97%   Filed Weights   02/09/18 0949  Weight: 176 lb 11.2 oz (80.2 kg)    GENERAL:alert, no distress and comfortable SKIN: skin color, texture, turgor are normal, no rashes or significant lesions EYES: normal, Conjunctiva are pink and non-injected, sclera clear OROPHARYNX:no exudate, no erythema and lips, buccal mucosa, and tongue normal  NECK: supple, thyroid normal size, non-tender, without nodularity LYMPH:  no palpable lymphadenopathy in the cervical, axillary or inguinal LUNGS: clear to auscultation and percussion with normal breathing effort HEART: regular rate & rhythm and no murmurs and no lower extremity edema  ABDOMEN:abdomen soft, non-tender and normal bowel sounds Musculoskeletal:no cyanosis of digits and no clubbing  NEURO: alert & oriented x 3 with fluent speech, no focal motor/sensory deficits  LABORATORY DATA:  I have reviewed the data as listed    Component Value Date/Time   NA 134 (L) 02/09/2018 0927   K 4.5 02/09/2018 0927   CL 100 02/09/2018 0927   CO2 24 02/09/2018 0927   GLUCOSE 345 (H) 02/09/2018 0927   BUN 19 02/09/2018 0927   CREATININE 1.22 (H) 02/09/2018 0927   CALCIUM 9.8 02/09/2018 0927   PROT 8.2 (H) 02/09/2018 0927   ALBUMIN 3.4 (L) 02/09/2018 0927   AST 9 (L) 02/09/2018 0927   ALT 11 02/09/2018 0927   ALKPHOS 113 02/09/2018 0927   BILITOT 0.4 02/09/2018 0927   GFRNONAA 47 (L) 02/09/2018 0927   GFRAA 54 (L) 02/09/2018 0927    No results found for:  SPEP, UPEP  Lab Results  Component Value Date   WBC 10.2 02/09/2018   NEUTROABS 6.9 (H) 02/09/2018   HGB 12.5 02/09/2018   HCT 37.2 02/09/2018   MCV 86.6 02/09/2018   PLT 390 02/09/2018      Chemistry      Component Value Date/Time   NA 134 (L) 02/09/2018 0927   K 4.5 02/09/2018 0927   CL 100 02/09/2018 0927   CO2 24 02/09/2018 0927   BUN 19 02/09/2018 0927   CREATININE 1.22 (H) 02/09/2018 0927      Component Value Date/Time   CALCIUM 9.8 02/09/2018 0927   ALKPHOS 113 02/09/2018 0927   AST 9 (L) 02/09/2018 0927   ALT 11 02/09/2018 0927   BILITOT 0.4 02/09/2018 0927      All questions were answered. The patient knows to call the clinic with any problems, questions or concerns. No barriers to learning was detected.  I spent 25 minutes counseling the patient face to face. The total time spent in the appointment was 30 minutes and more than 50% was on counseling and review of test results  Heath Lark, MD 02/10/2018 7:42 AM

## 2018-02-13 ENCOUNTER — Telehealth: Payer: Self-pay | Admitting: Oncology

## 2018-02-13 NOTE — Telephone Encounter (Signed)
[facility]ed [facility] regarding denied prior [facility] of Novolin R.  Asked if they could check if Humulin R, which is also on the prescription, to see if it would be covered by patient's insurance.  They said it is covered.  Called patient and advised her that the Humulin R is covered by medicaid.  She verbalized agreement and said that is what she has taken in the past.  Asked if she has found a primary care doctor yet and she said she is calling today.

## 2018-03-02 ENCOUNTER — Inpatient Hospital Stay (HOSPITAL_BASED_OUTPATIENT_CLINIC_OR_DEPARTMENT_OTHER): Payer: Medicaid Other | Admitting: Hematology and Oncology

## 2018-03-02 ENCOUNTER — Encounter: Payer: Self-pay | Admitting: Hematology and Oncology

## 2018-03-02 ENCOUNTER — Inpatient Hospital Stay: Payer: Medicaid Other

## 2018-03-02 ENCOUNTER — Other Ambulatory Visit: Payer: Self-pay | Admitting: Hematology and Oncology

## 2018-03-02 ENCOUNTER — Inpatient Hospital Stay: Payer: Medicaid Other | Attending: Gynecologic Oncology

## 2018-03-02 VITALS — BP 168/83 | HR 87 | Temp 98.0°F | Resp 18 | Ht 70.0 in | Wt 178.2 lb

## 2018-03-02 DIAGNOSIS — Z5111 Encounter for antineoplastic chemotherapy: Secondary | ICD-10-CM | POA: Insufficient documentation

## 2018-03-02 DIAGNOSIS — C7802 Secondary malignant neoplasm of left lung: Secondary | ICD-10-CM | POA: Diagnosis not present

## 2018-03-02 DIAGNOSIS — C541 Malignant neoplasm of endometrium: Secondary | ICD-10-CM | POA: Insufficient documentation

## 2018-03-02 DIAGNOSIS — E1065 Type 1 diabetes mellitus with hyperglycemia: Secondary | ICD-10-CM

## 2018-03-02 DIAGNOSIS — E1042 Type 1 diabetes mellitus with diabetic polyneuropathy: Secondary | ICD-10-CM

## 2018-03-02 DIAGNOSIS — C7800 Secondary malignant neoplasm of unspecified lung: Secondary | ICD-10-CM

## 2018-03-02 DIAGNOSIS — Z7189 Other specified counseling: Secondary | ICD-10-CM | POA: Insufficient documentation

## 2018-03-02 DIAGNOSIS — C7801 Secondary malignant neoplasm of right lung: Secondary | ICD-10-CM | POA: Insufficient documentation

## 2018-03-02 DIAGNOSIS — E1040 Type 1 diabetes mellitus with diabetic neuropathy, unspecified: Secondary | ICD-10-CM | POA: Diagnosis not present

## 2018-03-02 DIAGNOSIS — C787 Secondary malignant neoplasm of liver and intrahepatic bile duct: Secondary | ICD-10-CM | POA: Diagnosis not present

## 2018-03-02 LAB — COMPREHENSIVE METABOLIC PANEL
ALT: 9 U/L (ref 0–44)
AST: 11 U/L — ABNORMAL LOW (ref 15–41)
Albumin: 3.3 g/dL — ABNORMAL LOW (ref 3.5–5.0)
Alkaline Phosphatase: 108 U/L (ref 38–126)
Anion gap: 10 (ref 5–15)
BILIRUBIN TOTAL: 0.4 mg/dL (ref 0.3–1.2)
BUN: 20 mg/dL (ref 8–23)
CO2: 24 mmol/L (ref 22–32)
CREATININE: 1.22 mg/dL — AB (ref 0.44–1.00)
Calcium: 9.1 mg/dL (ref 8.9–10.3)
Chloride: 102 mmol/L (ref 98–111)
GFR calc non Af Amer: 47 mL/min — ABNORMAL LOW (ref >60)
GFR, EST AFRICAN AMERICAN: 54 mL/min — AB (ref >60)
Glucose, Bld: 367 mg/dL — ABNORMAL HIGH (ref 70–99)
Potassium: 4.2 mmol/L (ref 3.5–5.1)
SODIUM: 136 mmol/L (ref 135–145)
TOTAL PROTEIN: 7.5 g/dL (ref 6.5–8.1)

## 2018-03-02 LAB — CBC WITH DIFFERENTIAL/PLATELET
BASOS ABS: 0 10*3/uL (ref 0.0–0.1)
BASOS PCT: 0 %
EOS ABS: 0 10*3/uL (ref 0.0–0.5)
Eosinophils Relative: 1 %
HCT: 34.7 % — ABNORMAL LOW (ref 34.8–46.6)
HEMOGLOBIN: 11.4 g/dL — AB (ref 11.6–15.9)
Lymphocytes Relative: 35 %
Lymphs Abs: 2.7 10*3/uL (ref 0.9–3.3)
MCH: 29.7 pg (ref 25.1–34.0)
MCHC: 32.9 g/dL (ref 31.5–36.0)
MCV: 90.4 fL (ref 79.5–101.0)
MONOS PCT: 7 %
Monocytes Absolute: 0.5 10*3/uL (ref 0.1–0.9)
NEUTROS ABS: 4.5 10*3/uL (ref 1.5–6.5)
NEUTROS PCT: 57 %
Platelets: 317 10*3/uL (ref 145–400)
RBC: 3.84 MIL/uL (ref 3.70–5.45)
RDW: 15.4 % — ABNORMAL HIGH (ref 11.2–14.5)
WBC: 7.8 10*3/uL (ref 3.9–10.3)

## 2018-03-02 MED ORDER — SODIUM CHLORIDE 0.9 % IV SOLN
Freq: Once | INTRAVENOUS | Status: AC
  Administered 2018-03-02: 11:00:00 via INTRAVENOUS
  Filled 2018-03-02: qty 250

## 2018-03-02 MED ORDER — SODIUM CHLORIDE 0.9 % IV SOLN
433.5000 mg | Freq: Once | INTRAVENOUS | Status: AC
  Administered 2018-03-02: 430 mg via INTRAVENOUS
  Filled 2018-03-02: qty 43

## 2018-03-02 MED ORDER — FOSAPREPITANT DIMEGLUMINE INJECTION 150 MG
Freq: Once | INTRAVENOUS | Status: AC
  Administered 2018-03-02: 11:00:00 via INTRAVENOUS
  Filled 2018-03-02: qty 5

## 2018-03-02 MED ORDER — PALONOSETRON HCL INJECTION 0.25 MG/5ML
INTRAVENOUS | Status: AC
  Filled 2018-03-02: qty 5

## 2018-03-02 MED ORDER — PALONOSETRON HCL INJECTION 0.25 MG/5ML
0.2500 mg | Freq: Once | INTRAVENOUS | Status: AC
  Administered 2018-03-02: 0.25 mg via INTRAVENOUS

## 2018-03-02 NOTE — Assessment & Plan Note (Signed)
She has poorly controlled diabetes She is reminded to get established with new primary care doctor to manage her diabetes

## 2018-03-02 NOTE — Assessment & Plan Note (Signed)
She is not symptomatic Liver function test is stable

## 2018-03-02 NOTE — Assessment & Plan Note (Signed)
She tolerated chemotherapy well except for severe hypoglycemia which I believe is not related We will proceed with treatment without dose adjustment I plan to repeat imaging study after cycle 3 of therapy I advised the patient to seek primary care doctor for other health related issues.

## 2018-03-02 NOTE — Patient Instructions (Signed)
[facility] Discharge Instructions for Patients Receiving Chemotherapy  Today you received the following chemotherapy agents carboplatin  To help prevent nausea and vomiting after your treatment, we encourage you to take your nausea medication as directed   If you develop nausea and vomiting that is not controlled by your nausea medication, call the clinic.   BELOW ARE SYMPTOMS THAT SHOULD BE REPORTED IMMEDIATELY:  *FEVER GREATER THAN 100.5 F  *CHILLS WITH OR WITHOUT FEVER  NAUSEA AND VOMITING THAT IS NOT CONTROLLED WITH YOUR NAUSEA MEDICATION  *UNUSUAL SHORTNESS OF BREATH  *UNUSUAL BRUISING OR BLEEDING  TENDERNESS IN MOUTH AND THROAT WITH OR WITHOUT PRESENCE OF ULCERS  *URINARY PROBLEMS  *BOWEL PROBLEMS  UNUSUAL RASH Items with * indicate a potential emergency and should be followed up as soon as possible.  Feel free to call the clinic you have any questions or concerns. The clinic phone number is [phone].  

## 2018-03-02 NOTE — Progress Notes (Signed)
[place] OFFICE PROGRESS NOTE  Patient Care Team: Patient, No Pcp Per as PCP - General (General Practice)  ASSESSMENT & PLAN:  Endometrial cancer ([place]) She tolerated chemotherapy well except for severe hypoglycemia which I believe is not related We will proceed with treatment without dose adjustment I plan to repeat imaging study after cycle 3 of therapy I advised the patient to seek primary care doctor for other health related issues.  Metastasis to liver [facility]) She is not symptomatic Liver function test is stable  Metastasis to lung [facility]) She is not symptomatic Observe for now  Diabetic neuropathy associated with type 1 diabetes mellitus ([place]) She has poorly controlled diabetes She is reminded to get established with new primary care doctor to manage her diabetes   Orders Placed This Encounter  Procedures  . CT CHEST W CONTRAST    Standing Status:   Future    Standing Expiration Date:   03/03/2019    Order Specific Question:   If indicated for the ordered procedure, I authorize the administration of contrast media per Radiology protocol    Answer:   Yes    Order Specific Question:   Preferred imaging location?    Answer:   [facility]    Order Specific Question:   Radiology Contrast Protocol - do NOT remove file path    Answer:   \\[email or web address]  . CT ABDOMEN PELVIS W CONTRAST    Standing Status:   Future    Standing Expiration Date:   03/03/2019    Order Specific Question:   If indicated for the ordered procedure, I authorize the administration of contrast media per Radiology protocol    Answer:   Yes    Order Specific Question:   Preferred imaging location?    Answer:   [facility]    Order Specific Question:   Radiology Contrast Protocol - do NOT remove file path    Answer:   \\[email or web address]    INTERVAL HISTORY: Please see below for problem oriented charting. She returns for  cycle 3 of chemotherapy She feels well Her toes are healing well She has not been established to see her primary care doctor to manage her diabetes Her average blood sugar at home is about 200 She denies nausea or constipation No recent fever or chills Denies worsening neuropathy  SUMMARY OF ONCOLOGIC HISTORY: Oncology History   Endometrioid with squamous metaplasia     Endometrial cancer ([place])   12/22/2017 Pathology Results    Endometrial biopsy by Dr. Deatra Ina showed well differentiated endometrioid adenocarcinoma with squamous metaplasia (FIGO Grade I).    12/29/2017 Tumor Marker    Patient's tumor was tested for the following markers: CA-125 Results of the tumor marker test revealed 50.5    01/10/2018 Imaging    Large central uterine mass, consistent with known endometrial carcinoma.  Bulky left iliac lymphadenopathy, consistent with metastatic disease.  Left abdominal omental soft tissue nodules and nodule along the capsular surface of the left hepatic lobe, consistent with peritoneal metastases. No evidence of ascites.  3.5 cm right hepatic lobe mass, highly suspicious for liver metastases.  Bibasilar pulmonary metastases.     01/13/2018 Cancer Staging    Staging form: Corpus Uteri - Carcinoma and Carcinosarcoma, AJCC 8th Edition - Clinical: FIGO Stage IVB (cT3, cN2, cM1) - Signed by Heath Lark, MD on 01/13/2018    01/19/2018 Tumor Marker    Patient's tumor was tested for the following markers: CA-125 Results of the tumor marker  test revealed 38.9    01/19/2018 -  Chemotherapy    The patient had carboplatin    02/09/2018 Tumor Marker    Patient's tumor was tested for the following markers: CA-125 Results of the tumor marker test revealed 45.2     Metastasis to lung ([place])   01/13/2018 Initial Diagnosis    Metastasis to lung [facility])     Metastasis to liver ([place])   01/13/2018 Initial Diagnosis    Metastasis to liver ([facility])     REVIEW OF SYSTEMS:    Constitutional: Denies fevers, chills or abnormal weight loss Eyes: Denies blurriness of vision Ears, nose, mouth, throat, and face: Denies mucositis or sore throat Respiratory: Denies cough, dyspnea or wheezes Cardiovascular: Denies palpitation, chest discomfort or lower extremity swelling Gastrointestinal:  Denies nausea, heartburn or change in bowel habits Skin: Denies abnormal skin rashes Lymphatics: Denies new lymphadenopathy or easy bruising Neurological:Denies numbness, tingling or new weaknesses Behavioral/Psych: Mood is stable, no new changes  All other systems were reviewed with the patient and are negative.  I have reviewed the past medical history, past surgical history, social history and family history with the patient and they are unchanged from previous note.  ALLERGIES:  is allergic to poison oak extract.  MEDICATIONS:  Current Outpatient Medications  Medication Sig Dispense Refill  . acetaminophen (TYLENOL) 325 MG tablet Take 2 tablets (650 mg total) by mouth every 6 (six) hours as needed for mild pain (or Fever >/= 101).    . calcium carbonate (TUMS - DOSED IN MG ELEMENTAL CALCIUM) 500 MG chewable tablet Chew 1 tablet by mouth every 4 (four) hours as needed for indigestion or heartburn.    . Cholecalciferol (VITAMIN D3) 1000 units CAPS Take 1 capsule by mouth daily.    . insulin glargine (LANTUS) 100 UNIT/ML injection Inject 0.2 mLs (20 Units total) into the skin at bedtime. 1 vial 1  . insulin regular (NOVOLIN R,HUMULIN R) 100 units/mL injection Inject 0.05 mLs (5 Units total) into the skin 3 (three) times daily before meals. Hold if glucose less than 100. 10 mL 3  . levonorgestrel (MIRENA) 20 MCG/24HR IUD 1 Intra Uterine Device (1 each total) by Intrauterine route once for 1 dose. 1 each 0  . Multiple Vitamins-Minerals (MULTIVITAMIN WITH MINERALS) tablet Take 1 tablet by mouth daily.    . Needles & Syringes MISC 1 Units by Does not apply route daily. 1 each 0  .  ondansetron (ZOFRAN) 8 MG tablet Take 1 tablet (8 mg total) by mouth every 8 (eight) hours as needed for nausea. 30 tablet 3  . prochlorperazine (COMPAZINE) 10 MG tablet Take 1 tablet (10 mg total) by mouth every 6 (six) hours as needed for nausea or vomiting. 30 tablet 0  . saccharomyces boulardii (FLORASTOR) 250 MG capsule Take 1 capsule (250 mg total) by mouth 2 (two) times daily.    . traMADol (ULTRAM) 50 MG tablet Take 1 tablet (50 mg total) by mouth every 6 (six) hours as needed. 90 tablet 0   No current facility-administered medications for this visit.     PHYSICAL EXAMINATION: ECOG PERFORMANCE STATUS: 2 - Symptomatic, <50% confined to bed  Vitals:   03/02/18 1011  BP: (!) 168/83  Pulse: 87  Resp: 18  Temp: 98 F (36.7 C)  SpO2: 96%   Filed Weights   03/02/18 1011  Weight: 178 lb 3.2 oz (80.8 kg)    GENERAL:alert, no distress and comfortable SKIN: skin color, texture, turgor are normal, no rashes  or significant lesions EYES: normal, Conjunctiva are pink and non-injected, sclera clear OROPHARYNX:no exudate, no erythema and lips, buccal mucosa, and tongue normal  NECK: supple, thyroid normal size, non-tender, without nodularity LYMPH:  no palpable lymphadenopathy in the cervical, axillary or inguinal LUNGS: clear to auscultation and percussion with normal breathing effort HEART: regular rate & rhythm and no murmurs and no lower extremity edema ABDOMEN:abdomen soft, non-tender and normal bowel sounds Musculoskeletal:no cyanosis of digits and no clubbing  NEURO: alert & oriented x 3 with fluent speech, no focal motor/sensory deficits  LABORATORY DATA:  I have reviewed the data as listed    Component Value Date/Time   NA 136 03/02/2018 0940   K 4.2 03/02/2018 0940   CL 102 03/02/2018 0940   CO2 24 03/02/2018 0940   GLUCOSE 367 (H) 03/02/2018 0940   BUN 20 03/02/2018 0940   CREATININE 1.22 (H) 03/02/2018 0940   CALCIUM 9.1 03/02/2018 0940   PROT 7.5 03/02/2018 0940    ALBUMIN 3.3 (L) 03/02/2018 0940   AST 11 (L) 03/02/2018 0940   ALT 9 03/02/2018 0940   ALKPHOS 108 03/02/2018 0940   BILITOT 0.4 03/02/2018 0940   GFRNONAA 47 (L) 03/02/2018 0940   GFRAA 54 (L) 03/02/2018 0940    No results found for: SPEP, UPEP  Lab Results  Component Value Date   WBC 7.8 03/02/2018   NEUTROABS 4.5 03/02/2018   HGB 11.4 (L) 03/02/2018   HCT 34.7 (L) 03/02/2018   MCV 90.4 03/02/2018   PLT 317 03/02/2018      Chemistry      Component Value Date/Time   NA 136 03/02/2018 0940   K 4.2 03/02/2018 0940   CL 102 03/02/2018 0940   CO2 24 03/02/2018 0940   BUN 20 03/02/2018 0940   CREATININE 1.22 (H) 03/02/2018 0940      Component Value Date/Time   CALCIUM 9.1 03/02/2018 0940   ALKPHOS 108 03/02/2018 0940   AST 11 (L) 03/02/2018 0940   ALT 9 03/02/2018 0940   BILITOT 0.4 03/02/2018 0940     All questions were answered. The patient knows to call the clinic with any problems, questions or concerns. No barriers to learning was detected.  I spent 25 minutes counseling the patient face to face. The total time spent in the appointment was 30 minutes and more than 50% was on counseling and review of test results  Heath Lark, MD 03/02/2018 4:04 PM

## 2018-03-02 NOTE — Assessment & Plan Note (Signed)
She is not symptomatic. Observe for now 

## 2018-03-03 LAB — CA 125: Cancer Antigen (CA) 125: 35.8 U/mL (ref 0.0–38.1)

## 2018-03-13 ENCOUNTER — Telehealth: Payer: Self-pay | Admitting: Oncology

## 2018-03-13 NOTE — Telephone Encounter (Signed)
Brenda Moore called and asked if she has a CT scan scheduled yet.  Advised her that it has not been scheduled but is authorized.  She was given central scheduling's number to call to schedule the scan.

## 2018-03-21 ENCOUNTER — Ambulatory Visit (HOSPITAL_COMMUNITY)
Admission: RE | Admit: 2018-03-21 | Discharge: 2018-03-21 | Disposition: A | Discharge status: Home / Self Care | Payer: Medicaid Other | Source: Ambulatory Visit | Attending: Hematology and Oncology | Admitting: Hematology and Oncology

## 2018-03-21 ENCOUNTER — Ambulatory Visit (HOSPITAL_COMMUNITY): Payer: Medicaid Other

## 2018-03-21 ENCOUNTER — Encounter (HOSPITAL_COMMUNITY): Payer: Self-pay

## 2018-03-21 DIAGNOSIS — C787 Secondary malignant neoplasm of liver and intrahepatic bile duct: Secondary | ICD-10-CM | POA: Insufficient documentation

## 2018-03-21 DIAGNOSIS — C541 Malignant neoplasm of endometrium: Secondary | ICD-10-CM | POA: Diagnosis not present

## 2018-03-21 DIAGNOSIS — C7800 Secondary malignant neoplasm of unspecified lung: Secondary | ICD-10-CM

## 2018-03-21 MED ORDER — IOHEXOL 300 MG/ML  SOLN
100.0000 mL | Freq: Once | INTRAMUSCULAR | Status: AC | PRN
  Administered 2018-03-21: 100 mL via INTRAVENOUS

## 2018-03-23 ENCOUNTER — Inpatient Hospital Stay: Payer: Medicaid Other

## 2018-03-23 ENCOUNTER — Inpatient Hospital Stay (HOSPITAL_BASED_OUTPATIENT_CLINIC_OR_DEPARTMENT_OTHER): Payer: Medicaid Other | Admitting: Hematology and Oncology

## 2018-03-23 ENCOUNTER — Encounter: Payer: Self-pay | Admitting: Hematology and Oncology

## 2018-03-23 DIAGNOSIS — Z7189 Other specified counseling: Secondary | ICD-10-CM

## 2018-03-23 DIAGNOSIS — C787 Secondary malignant neoplasm of liver and intrahepatic bile duct: Secondary | ICD-10-CM | POA: Diagnosis not present

## 2018-03-23 DIAGNOSIS — C7801 Secondary malignant neoplasm of right lung: Secondary | ICD-10-CM | POA: Diagnosis not present

## 2018-03-23 DIAGNOSIS — Z5111 Encounter for antineoplastic chemotherapy: Secondary | ICD-10-CM | POA: Diagnosis not present

## 2018-03-23 DIAGNOSIS — C7802 Secondary malignant neoplasm of left lung: Secondary | ICD-10-CM

## 2018-03-23 DIAGNOSIS — C541 Malignant neoplasm of endometrium: Secondary | ICD-10-CM | POA: Diagnosis not present

## 2018-03-23 DIAGNOSIS — E1042 Type 1 diabetes mellitus with diabetic polyneuropathy: Secondary | ICD-10-CM

## 2018-03-23 DIAGNOSIS — C7800 Secondary malignant neoplasm of unspecified lung: Secondary | ICD-10-CM

## 2018-03-23 NOTE — Assessment & Plan Note (Signed)
She has severe persistent insulin-dependent diabetes I would defer to her primary care doctor for further management

## 2018-03-23 NOTE — Progress Notes (Signed)
[place] OFFICE PROGRESS NOTE  Patient Care Team: Perrin Maltese, MD as PCP - General (Internal Medicine)  ASSESSMENT & PLAN:  Endometrial cancer [facility]) Unfortunately, she has significant disease progression Her prognosis is poor Due to her poor baseline comorbidities, it is not clear to me she can withstand other types of chemotherapy due to anticipated risk of infection, and the presence of chronic nonhealing wound Currently, she is not symptomatic from disease We discussed the risk and benefits of palliative chemotherapy versus hospice She is undecided I will proceed to cancel her appointment today I will discuss with the GYN oncology navigator to call the patient next week for final decision about plan of care I am not enthusiastic to prescribe paclitaxel due to severe baseline peripheral neuropathy Certainly, we can consider doxorubicin but again, I anticipate excessive risk of infection due to her chronic nonhealing wound  Metastasis to liver [facility]) Unfortunately, her liver metastasis is growing Thankfully, she is not symptomatic We will observe  Metastasis to lung [facility]) She has significant, diffuse metastatic disease in both lungs She is not symptomatic For now, I recommend close observation of palliative chemotherapy  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   INTERVAL HISTORY: Please see below for problem oriented charting. She returns for further follow-up She complain of persistent nonhealing wound No recent nausea Denies recent recurrent infection, fever or chills  SUMMARY OF  ONCOLOGIC HISTORY: Oncology History   Endometrioid with squamous metaplasia     Endometrial cancer ([place])   12/22/2017 Pathology Results    Endometrial biopsy by Dr. Deatra Ina showed well differentiated endometrioid adenocarcinoma with squamous metaplasia (FIGO Grade I).    12/29/2017 Tumor Marker    Patient's tumor was tested for the following markers: CA-125 Results of the tumor marker test revealed 50.5    01/10/2018 Imaging    Large central uterine mass, consistent with known endometrial carcinoma.  Bulky left iliac lymphadenopathy, consistent with metastatic disease.  Left abdominal omental soft tissue nodules and nodule along the capsular surface of the left hepatic lobe, consistent with peritoneal metastases. No evidence of ascites.  3.5 cm right hepatic lobe mass, highly suspicious for liver metastases.  Bibasilar pulmonary metastases.     01/13/2018 Cancer Staging    Staging form: Corpus Uteri - Carcinoma and Carcinosarcoma, AJCC 8th Edition - Clinical: FIGO Stage IVB (cT3, cN2, cM1) - Signed by Heath Lark, MD on 01/13/2018    01/19/2018 Tumor Marker    Patient's tumor was tested for the following markers: CA-125 Results of the tumor marker test revealed 38.9    01/19/2018 -  Chemotherapy    The patient had carboplatin    02/09/2018 Tumor Marker    Patient's tumor was tested for the following markers: CA-125 Results of the tumor marker test revealed 45.2    03/02/2018 Tumor Marker    Patient's tumor was tested for the following markers: CA-125 Results of the tumor marker test revealed 35.8    03/21/2018 Imaging    1. Numerous bilateral pulmonary metastatic nodules. The 2 lower lobe nodules appear relatively stable. 2. Slightly progressive hepatic lesions as detailed above. 3. Most of the  omental lesions are slightly larger. 4. Stable endometrial mass. 5. Slight decrease and bulky left pelvic sidewall adenopathy. 6. More solid appearing 2.5 cm right inguinal lymph node  could be metastatic disease.     Metastasis to lung ([place])   01/13/2018 Initial Diagnosis    Metastasis to lung [facility])     Metastasis to liver ([place])   01/13/2018 Initial Diagnosis    Metastasis to liver ([facility])     REVIEW OF SYSTEMS:   Constitutional: Denies fevers, chills or abnormal weight loss Eyes: Denies blurriness of vision Ears, nose, mouth, throat, and face: Denies mucositis or sore throat Respiratory: Denies cough, dyspnea or wheezes Cardiovascular: Denies palpitation, chest discomfort or lower extremity swelling Gastrointestinal:  Denies nausea, heartburn or change in bowel habits Skin: Denies abnormal skin rashes Lymphatics: Denies new lymphadenopathy or easy bruising Neurological:Denies numbness, tingling or new weaknesses Behavioral/Psych: Mood is stable, no new changes  All other systems were reviewed with the patient and are negative.  I have reviewed the past medical history, past surgical history, social history and family history with the patient and they are unchanged from previous note.  ALLERGIES:  is allergic to poison oak extract.  MEDICATIONS:  Current Outpatient Medications  Medication Sig Dispense Refill  . acetaminophen (TYLENOL) 325 MG tablet Take 2 tablets (650 mg total) by mouth every 6 (six) hours as needed for mild pain (or Fever >/= 101).    . calcium carbonate (TUMS - DOSED IN MG ELEMENTAL CALCIUM) 500 MG chewable tablet Chew 1 tablet by mouth every 4 (four) hours as needed for indigestion or heartburn.    . Cholecalciferol (VITAMIN D3) 1000 units CAPS Take 1 capsule by mouth daily.    . insulin glargine (LANTUS) 100 UNIT/ML injection Inject 0.2 mLs (20 Units total) into the skin at bedtime. 1 vial 1  . insulin regular (NOVOLIN R,HUMULIN R) 100 units/mL injection Inject 0.05 mLs (5 Units total) into the skin 3 (three) times daily before meals. Hold if glucose less than 100. 10 mL 3  . levonorgestrel (MIRENA) 20 MCG/24HR IUD 1 Intra Uterine Device (1  each total) by Intrauterine route once for 1 dose. 1 each 0  . Multiple Vitamins-Minerals (MULTIVITAMIN WITH MINERALS) tablet Take 1 tablet by mouth daily.    . Needles & Syringes MISC 1 Units by Does not apply route daily. 1 each 0  . ondansetron (ZOFRAN) 8 MG tablet Take 1 tablet (8 mg total) by mouth every 8 (eight) hours as needed for nausea. 30 tablet 3  . prochlorperazine (COMPAZINE) 10 MG tablet Take 1 tablet (10 mg total) by mouth every 6 (six) hours as needed for nausea or vomiting. 30 tablet 0  . saccharomyces boulardii (FLORASTOR) 250 MG capsule Take 1 capsule (250 mg total) by mouth 2 (two) times daily.    . traMADol (ULTRAM) 50 MG tablet Take 1 tablet (50 mg total) by mouth every 6 (six) hours as needed. 90 tablet 0   No current facility-administered medications for this visit.     PHYSICAL EXAMINATION: ECOG PERFORMANCE STATUS: 1 - Symptomatic but completely ambulatory  Vitals:   03/23/18 0831  BP: (!) 159/83  Pulse: 90  Resp: 18  Temp: 98.6 F (37 C)  SpO2: 97%   Filed Weights   03/23/18 0831  Weight: 180 lb 14.4 oz (82.1 kg)    GENERAL:alert, no distress and comfortable NEURO: alert & oriented x 3 with fluent speech, no focal motor/sensory deficits  LABORATORY DATA:  I have reviewed the  data as listed    Component Value Date/Time   NA 136 03/02/2018 0940   K 4.2 03/02/2018 0940   CL 102 03/02/2018 0940   CO2 24 03/02/2018 0940   GLUCOSE 367 (H) 03/02/2018 0940   BUN 20 03/02/2018 0940   CREATININE 1.22 (H) 03/02/2018 0940   CALCIUM 9.1 03/02/2018 0940   PROT 7.5 03/02/2018 0940   ALBUMIN 3.3 (L) 03/02/2018 0940   AST 11 (L) 03/02/2018 0940   ALT 9 03/02/2018 0940   ALKPHOS 108 03/02/2018 0940   BILITOT 0.4 03/02/2018 0940   GFRNONAA 47 (L) 03/02/2018 0940   GFRAA 54 (L) 03/02/2018 0940    No results found for: SPEP, UPEP  Lab Results  Component Value Date   WBC 7.8 03/02/2018   NEUTROABS 4.5 03/02/2018   HGB 11.4 (L) 03/02/2018   HCT 34.7 (L)  03/02/2018   MCV 90.4 03/02/2018   PLT 317 03/02/2018      Chemistry      Component Value Date/Time   NA 136 03/02/2018 0940   K 4.2 03/02/2018 0940   CL 102 03/02/2018 0940   CO2 24 03/02/2018 0940   BUN 20 03/02/2018 0940   CREATININE 1.22 (H) 03/02/2018 0940      Component Value Date/Time   CALCIUM 9.1 03/02/2018 0940   ALKPHOS 108 03/02/2018 0940   AST 11 (L) 03/02/2018 0940   ALT 9 03/02/2018 0940   BILITOT 0.4 03/02/2018 0940       RADIOGRAPHIC STUDIES: I have reviewed multiple imaging studies with the patient I have personally reviewed the radiological images as listed and agreed with the findings in the report. Ct Chest W Contrast  Result Date: 03/21/2018 CLINICAL DATA:  Metastatic endometrial cancer status post 3 sessions of chemotherapy. EXAM: CT CHEST, ABDOMEN, AND PELVIS WITH CONTRAST TECHNIQUE: Multidetector CT imaging of the chest, abdomen and pelvis was performed following the standard protocol during bolus administration of intravenous contrast. CONTRAST:  179mL OMNIPAQUE IOHEXOL 300 MG/ML  SOLN COMPARISON:  CT abdomen/pelvis 01/10/2018 FINDINGS: CT CHEST FINDINGS Cardiovascular: The heart is normal in size. No pericardial effusion. The aorta is normal in caliber. No aneurysm or dissection. Scattered atherosclerotic calcifications. The branch vessels are patent. Scattered coronary artery calcifications. Mediastinum/Nodes: Small scattered mediastinal and hilar lymph nodes but no mass or overt adenopathy. The esophagus is grossly normal. Lungs/Pleura: Diffuse pulmonary metastatic disease. Index lesion in the right upper lobe on image number 51 measures 18.5 x 16 mm. This was not covered on the prior abdominal CT scan. Index lesion in the left lower lobe measures 11 x 8.5 mm on image number 89. This appears stable. 9 x 9 mm index lesion in the right lower lobe on image number 95. This appears stable. Chest wall/musculoskeletal: No breast masses, supraclavicular or axillary  adenopathy. The thyroid gland appears normal. Indeterminate lesion in the T7 vertebral body. No other definite bone lesions. Degenerative changes involving the spine. CT ABDOMEN PELVIS FINDINGS Hepatobiliary: 14 mm peritoneal surface lesion involving the anterior aspect of the liver on image number 59 is unchanged. The segment 6 lesion measures 4.2 x 3.0 cm and previously measured 3.5 x 2.4 cm. 19 mm segment 3 lesion on image number 62 was definitely present on the prior study but was subtle and difficult to measure. Pancreas: No mass, inflammation or ductal dilatation. Spleen: Normal size.  No focal lesions. Adrenals/Urinary Tract: The adrenal glands and kidneys are unremarkable and stable. The bladder is grossly normal. Stomach/Bowel: The stomach, duodenum, small  bowel and colon are unremarkable. No acute inflammatory changes, mass lesions or obstructive findings. Sigmoid colon diverticulosis without findings for acute diverticulitis. Vascular/Lymphatic: Numerous omental metastatic implants are again demonstrated. 20 x 14.5 mm omental lesion on image number 74 on the left side previously measured 12.5 x 12 mm. 10 mm omental lesion near the midline on image number 75 previously measured 9 mm. 12 mm right omental lesion on image number 76 previously measured 16 mm. 13.5 mm right-sided omental lesion on image number 81 previously measured 11 mm. 20.5 x 18.5 mm left lower omental lesion on image number 88 previously measured 19.5 x 14.5 mm. Reproductive: Stable appearing endometrial tumor measuring approximately 6.3 x 5.5 cm on image number 106. Previously measured 6.2 x 5.3 cm. Bulky left pelvic sidewall necrotic adenopathy is again demonstrated. The upper portion has a maximum transverse diameter of 32 mm and this was previously 33.5 mm. The lower more necrotic appearing portion measures 40 x 23 mm and previously measured 52 x 32 mm. Stable appearing bilateral external iliac artery lymph nodes. Other: No inguinal  mass or hernia. 2.5 cm right inguinal lymph node appears more solid and is worrisome for metastatic disease. Musculoskeletal: No obvious bone lesions or acute bony findings. IMPRESSION: 1. Numerous bilateral pulmonary metastatic nodules. The 2 lower lobe nodules appear relatively stable. 2. Slightly progressive hepatic lesions as detailed above. 3. Most of the omental lesions are slightly larger. 4. Stable endometrial mass. 5. Slight decrease and bulky left pelvic sidewall adenopathy. 6. More solid appearing 2.5 cm right inguinal lymph node could be metastatic disease. Electronically Signed   By: Marijo Sanes M.D.   On: 03/21/2018 16:01   Ct Abdomen Pelvis W Contrast  Result Date: 03/21/2018 CLINICAL DATA:  Metastatic endometrial cancer status post 3 sessions of chemotherapy. EXAM: CT CHEST, ABDOMEN, AND PELVIS WITH CONTRAST TECHNIQUE: Multidetector CT imaging of the chest, abdomen and pelvis was performed following the standard protocol during bolus administration of intravenous contrast. CONTRAST:  178mL OMNIPAQUE IOHEXOL 300 MG/ML  SOLN COMPARISON:  CT abdomen/pelvis 01/10/2018 FINDINGS: CT CHEST FINDINGS Cardiovascular: The heart is normal in size. No pericardial effusion. The aorta is normal in caliber. No aneurysm or dissection. Scattered atherosclerotic calcifications. The branch vessels are patent. Scattered coronary artery calcifications. Mediastinum/Nodes: Small scattered mediastinal and hilar lymph nodes but no mass or overt adenopathy. The esophagus is grossly normal. Lungs/Pleura: Diffuse pulmonary metastatic disease. Index lesion in the right upper lobe on image number 51 measures 18.5 x 16 mm. This was not covered on the prior abdominal CT scan. Index lesion in the left lower lobe measures 11 x 8.5 mm on image number 89. This appears stable. 9 x 9 mm index lesion in the right lower lobe on image number 95. This appears stable. Chest wall/musculoskeletal: No breast masses, supraclavicular or  axillary adenopathy. The thyroid gland appears normal. Indeterminate lesion in the T7 vertebral body. No other definite bone lesions. Degenerative changes involving the spine. CT ABDOMEN PELVIS FINDINGS Hepatobiliary: 14 mm peritoneal surface lesion involving the anterior aspect of the liver on image number 59 is unchanged. The segment 6 lesion measures 4.2 x 3.0 cm and previously measured 3.5 x 2.4 cm. 19 mm segment 3 lesion on image number 62 was definitely present on the prior study but was subtle and difficult to measure. Pancreas: No mass, inflammation or ductal dilatation. Spleen: Normal size.  No focal lesions. Adrenals/Urinary Tract: The adrenal glands and kidneys are unremarkable and stable. The bladder is grossly normal.  Stomach/Bowel: The stomach, duodenum, small bowel and colon are unremarkable. No acute inflammatory changes, mass lesions or obstructive findings. Sigmoid colon diverticulosis without findings for acute diverticulitis. Vascular/Lymphatic: Numerous omental metastatic implants are again demonstrated. 20 x 14.5 mm omental lesion on image number 74 on the left side previously measured 12.5 x 12 mm. 10 mm omental lesion near the midline on image number 75 previously measured 9 mm. 12 mm right omental lesion on image number 76 previously measured 16 mm. 13.5 mm right-sided omental lesion on image number 81 previously measured 11 mm. 20.5 x 18.5 mm left lower omental lesion on image number 88 previously measured 19.5 x 14.5 mm. Reproductive: Stable appearing endometrial tumor measuring approximately 6.3 x 5.5 cm on image number 106. Previously measured 6.2 x 5.3 cm. Bulky left pelvic sidewall necrotic adenopathy is again demonstrated. The upper portion has a maximum transverse diameter of 32 mm and this was previously 33.5 mm. The lower more necrotic appearing portion measures 40 x 23 mm and previously measured 52 x 32 mm. Stable appearing bilateral external iliac artery lymph nodes. Other: No  inguinal mass or hernia. 2.5 cm right inguinal lymph node appears more solid and is worrisome for metastatic disease. Musculoskeletal: No obvious bone lesions or acute bony findings. IMPRESSION: 1. Numerous bilateral pulmonary metastatic nodules. The 2 lower lobe nodules appear relatively stable. 2. Slightly progressive hepatic lesions as detailed above. 3. Most of the omental lesions are slightly larger. 4. Stable endometrial mass. 5. Slight decrease and bulky left pelvic sidewall adenopathy. 6. More solid appearing 2.5 cm right inguinal lymph node could be metastatic disease. Electronically Signed   By: Marijo Sanes M.D.   On: 03/21/2018 16:01    All questions were answered. The patient knows to call the clinic with any problems, questions or concerns. No barriers to learning was detected.  I spent 25 minutes counseling the patient face to face. The total time spent in the appointment was 30 minutes and more than 50% was on counseling and review of test results  Heath Lark, MD 03/23/2018 4:14 PM

## 2018-03-23 NOTE — Assessment & Plan Note (Signed)
Unfortunately, her liver metastasis is growing Thankfully, she is not symptomatic We will observe

## 2018-03-23 NOTE — Assessment & Plan Note (Signed)
We had extensive discussion about goals of care and prognosis The patient is undecided She will call me with decision next week

## 2018-03-23 NOTE — Assessment & Plan Note (Signed)
She has significant, diffuse metastatic disease in both lungs She is not symptomatic For now, I recommend close observation of palliative chemotherapy

## 2018-03-23 NOTE — Assessment & Plan Note (Signed)
Unfortunately, she has significant disease progression Her prognosis is poor Due to her poor baseline comorbidities, it is not clear to me she can withstand other types of chemotherapy due to anticipated risk of infection, and the presence of chronic nonhealing wound Currently, she is not symptomatic from disease We discussed the risk and benefits of palliative chemotherapy versus hospice She is undecided I will proceed to cancel her appointment today I will discuss with the GYN oncology navigator to call the patient next week for final decision about plan of care I am not enthusiastic to prescribe paclitaxel due to severe baseline peripheral neuropathy Certainly, we can consider doxorubicin but again, I anticipate excessive risk of infection due to her chronic nonhealing wound

## 2018-03-29 ENCOUNTER — Telehealth: Payer: Self-pay | Admitting: Oncology

## 2018-03-29 NOTE — Telephone Encounter (Signed)
Called Brenda Moore to see if she has decided on treatment or hospice.  She said she is feeling really good now, better than during chemotherapy.  She had questions about the palliative chemotherapy that Dr. Alvy Bimler discussed with her.  Talked about paclitaxel and risk for peripheral neuropathy and doxorubicin and risk of infection with her wound per Dr. Calton Dach office note.  She said she does not want chemotherapy.  Asked if she would like to discuss it more with Dr. Alvy Bimler and she said no.  She also said she does not want to contact hospice now since she is feeling good.  Advised her to call if she has any questions or change in condition.  Also advised her that I will check with her at the end of next week to see how she is feeling.

## 2018-03-30 NOTE — Telephone Encounter (Signed)
Entered in error

## 2018-04-03 ENCOUNTER — Telehealth: Payer: Self-pay

## 2018-04-03 ENCOUNTER — Ambulatory Visit (HOSPITAL_COMMUNITY): Payer: Medicaid Other

## 2018-04-03 NOTE — Telephone Encounter (Signed)
Received fax from [facility] Missed Visit Note stating "pt declined SN to schedule another nurse visit for wound care".  This RN spoke with pt by phone to find out why pt declining services.  Pt states she does want to continue home care wound care and that it is Hospice services that she is declining at this time. [facility] notified and they will reach out to patient to schedule home care visits for wound care.

## 2018-04-05 ENCOUNTER — Encounter (HOSPITAL_BASED_OUTPATIENT_CLINIC_OR_DEPARTMENT_OTHER): Payer: Medicaid Other | Attending: Physician Assistant

## 2018-04-05 ENCOUNTER — Encounter (HOSPITAL_BASED_OUTPATIENT_CLINIC_OR_DEPARTMENT_OTHER): Payer: Self-pay

## 2018-04-05 DIAGNOSIS — E1140 Type 2 diabetes mellitus with diabetic neuropathy, unspecified: Secondary | ICD-10-CM | POA: Insufficient documentation

## 2018-04-05 DIAGNOSIS — C787 Secondary malignant neoplasm of liver and intrahepatic bile duct: Secondary | ICD-10-CM | POA: Insufficient documentation

## 2018-04-05 DIAGNOSIS — L84 Corns and callosities: Secondary | ICD-10-CM | POA: Diagnosis not present

## 2018-04-05 DIAGNOSIS — C7800 Secondary malignant neoplasm of unspecified lung: Secondary | ICD-10-CM | POA: Diagnosis not present

## 2018-04-05 DIAGNOSIS — Z87891 Personal history of nicotine dependence: Secondary | ICD-10-CM | POA: Insufficient documentation

## 2018-04-05 DIAGNOSIS — E11621 Type 2 diabetes mellitus with foot ulcer: Secondary | ICD-10-CM | POA: Insufficient documentation

## 2018-04-05 DIAGNOSIS — L97512 Non-pressure chronic ulcer of other part of right foot with fat layer exposed: Secondary | ICD-10-CM | POA: Diagnosis not present

## 2018-04-05 DIAGNOSIS — Z9221 Personal history of antineoplastic chemotherapy: Secondary | ICD-10-CM | POA: Diagnosis not present

## 2018-04-05 DIAGNOSIS — Z89422 Acquired absence of other left toe(s): Secondary | ICD-10-CM | POA: Diagnosis not present

## 2018-04-05 DIAGNOSIS — Z794 Long term (current) use of insulin: Secondary | ICD-10-CM | POA: Insufficient documentation

## 2018-04-05 DIAGNOSIS — C541 Malignant neoplasm of endometrium: Secondary | ICD-10-CM | POA: Insufficient documentation

## 2018-04-06 ENCOUNTER — Telehealth: Payer: Self-pay | Admitting: Oncology

## 2018-04-06 NOTE — Telephone Encounter (Signed)
Left a message for patient to see how she is feeling.  Requested a return call.

## 2018-04-07 ENCOUNTER — Ambulatory Visit: Payer: Self-pay | Admitting: Gynecologic Oncology

## 2018-04-17 ENCOUNTER — Telehealth: Payer: Self-pay

## 2018-04-17 NOTE — Telephone Encounter (Signed)
Pt lvm stating that she has temp of 104.0 and has been vomitting. Returned call and and pt states fever, chills, vomitting off and on since last Thursday.  Temp goes down with Tylenol. Pt states she cannot come here to our [facility] but that her parents can take her to [facility] if needed.  This RN advised pt to be evaluated by a physician today to assess if she is developing an infection.  Pt verbalizes understanding.

## 2018-04-19 ENCOUNTER — Inpatient Hospital Stay: Payer: Medicaid Other | Attending: Gynecologic Oncology | Admitting: Medical

## 2018-04-19 ENCOUNTER — Telehealth: Payer: Self-pay | Admitting: Oncology

## 2018-04-19 ENCOUNTER — Inpatient Hospital Stay: Payer: Medicaid Other

## 2018-04-19 ENCOUNTER — Other Ambulatory Visit: Payer: Self-pay | Admitting: Oncology

## 2018-04-19 ENCOUNTER — Ambulatory Visit (HOSPITAL_COMMUNITY)
Admission: RE | Admit: 2018-04-19 | Discharge: 2018-04-19 | Disposition: A | Discharge status: Home / Self Care | Payer: Medicaid Other | Source: Ambulatory Visit | Attending: Medical | Admitting: Medical

## 2018-04-19 VITALS — BP 137/69 | HR 93 | Temp 99.4°F | Resp 18 | Ht 70.0 in | Wt 181.3 lb

## 2018-04-19 DIAGNOSIS — C541 Malignant neoplasm of endometrium: Secondary | ICD-10-CM | POA: Diagnosis not present

## 2018-04-19 DIAGNOSIS — M25462 Effusion, left knee: Secondary | ICD-10-CM | POA: Insufficient documentation

## 2018-04-19 DIAGNOSIS — E11621 Type 2 diabetes mellitus with foot ulcer: Secondary | ICD-10-CM | POA: Diagnosis not present

## 2018-04-19 DIAGNOSIS — M25562 Pain in left knee: Secondary | ICD-10-CM | POA: Insufficient documentation

## 2018-04-19 DIAGNOSIS — C787 Secondary malignant neoplasm of liver and intrahepatic bile duct: Secondary | ICD-10-CM | POA: Diagnosis not present

## 2018-04-19 DIAGNOSIS — R509 Fever, unspecified: Secondary | ICD-10-CM

## 2018-04-19 DIAGNOSIS — C7800 Secondary malignant neoplasm of unspecified lung: Secondary | ICD-10-CM | POA: Insufficient documentation

## 2018-04-19 LAB — CBC WITH DIFFERENTIAL/PLATELET
Basophils Absolute: 0 10*3/uL (ref 0.0–0.1)
Basophils Relative: 0 %
EOS PCT: 1 %
Eosinophils Absolute: 0.1 10*3/uL (ref 0.0–0.5)
HCT: 33.1 % — ABNORMAL LOW (ref 34.8–46.6)
Hemoglobin: 11 g/dL — ABNORMAL LOW (ref 11.6–15.9)
LYMPHS PCT: 18 %
Lymphs Abs: 1.4 10*3/uL (ref 0.9–3.3)
MCH: 30.7 pg (ref 25.1–34.0)
MCHC: 33.2 g/dL (ref 31.5–36.0)
MCV: 92.4 fL (ref 79.5–101.0)
MONO ABS: 0.7 10*3/uL (ref 0.1–0.9)
Monocytes Relative: 10 %
Neutro Abs: 5.4 10*3/uL (ref 1.5–6.5)
Neutrophils Relative %: 71 %
PLATELETS: 212 10*3/uL (ref 145–400)
RBC: 3.58 MIL/uL — AB (ref 3.70–5.45)
RDW: 16.9 % — ABNORMAL HIGH (ref 11.2–14.5)
WBC: 7.6 10*3/uL (ref 3.9–10.3)

## 2018-04-19 MED ORDER — SULFAMETHOXAZOLE-TRIMETHOPRIM 800-160 MG PO TABS: 1.0000 | ORAL_TABLET | Freq: Two times a day (BID) | ORAL | 0 refills | Status: AC

## 2018-04-19 NOTE — Telephone Encounter (Signed)
Zakayla called and said she had called on Monday and reported having high fevers (103-104 degrees) but was unable to be seen because she did not have a ride.  She just finished an appointment with the wound clinic and is wondering if she can be seen today.  She is still running high fevers.  She also said the wound clinic doctor cleared her foot wound as the cause of the fever/infection.  Scheduled an appointment with Sandi Mealy, PA with the [facility].

## 2018-04-20 ENCOUNTER — Other Ambulatory Visit: Payer: Self-pay | Admitting: Medical

## 2018-04-20 MED ORDER — HYDROCODONE-ACETAMINOPHEN 5-325 MG PO TABS: 1.0000 | ORAL_TABLET | Freq: Four times a day (QID) | ORAL | 0 refills | Status: AC | PRN

## 2018-04-20 NOTE — Progress Notes (Signed)
These results were called to [facility] and were reviewed with her . Her were answered. She expressed understanding.

## 2018-04-21 ENCOUNTER — Telehealth: Payer: Self-pay | Admitting: Hematology & Oncology

## 2018-04-21 NOTE — Progress Notes (Signed)
[facility] Progress Note   Brenda Moore [id number] 1955/12/31 62 y.o.  Brenda Moore is managed by Dr. Heath Lark  Actively treated with chemotherapy/immunotherapy: no   Assessment: Plan:    Acute pain of left knee - Plan: DG Knee 1-2 Views Left  Fever and chills - Plan: sulfamethoxazole-trimethoprim (BACTRIM DS,SEPTRA DS) 800-160 MG tablet  Endometrial cancer ([place])   Acute left knee pain with swelling: The patient was referred for an x-ray of her left knee.  Fevers and chills: The patient was given a prescription for Bactrim DS, 1 p.o. twice daily x7 days.  Metastatic progressive endometrial cancer: Dr. Heath Lark as discussed with the patient that there are no additional treatments immediately available for her.  She had recommended hospice.  The patient is not yet ready to agree to hospice.  I have discussed with her today the option of palliative care.  The patient is agreed to consider this.  Please see After Visit Summary for patient specific instructions.  No future appointments.  Orders Placed This Encounter  Procedures  . DG Knee 1-2 Views Left       Subjective:   Patient ID:  Brenda Moore is a 62 y.o. (DOB May 03, 1956) female.  Chief Complaint:  Chief Complaint  Patient presents with  . Fever  . 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  The patient has been managed by Dr. Heath Lark.  She was last seen on 03/23/2018.  Dr. Heath Lark has informed the patient that there are no additional treatments immediately available for her.  She is recommended transitioning to hospice.  The patient does not want to pursue hospice at this time.  She has a history of diabetes and has had a diabetic foot ulcer on her right foot.  She was seen by her doctor today who reports that her foot ulcer is healing.  He  additionally removed callus from her left foot.  She has had fevers.  She reports that she had been having pain in her right side on Thursday and was vomiting.  Friday and Saturday she felt fine.  Sunday she developed a fever that ranged from 10 3-1 04.  She additionally had Reiger's.  Her fever continued on Monday.  She has had no fever since then.  She states that her fevers, chills, and vomiting resolve a short time after taking Tylenol.  She is been having left knee pain with swelling.  She has a history of in a CL tear.  She has had no changes in activity and has had no recent trauma.  Medications: I have reviewed the patient's current medications.  Allergies:  Allergies  Allergen Reactions  . Poison Oak Extract Rash    Past Medical History:  Diagnosis Date  . Anemia   . Diabetes mellitus without complication ([place])   . Osteomyelitis of right foot [facility])     Past Surgical History:  Procedure Laterality Date  . SKIN GRAFT Left    age 35 , skin graft to left leg due to lawn mower accident  . toe amptuation Left    age 7 - left little toe amputated due to lawn mower accident    Family History  Problem Relation Age of Onset  . Lymphoma Mother   . Heart disease Mother   . Prostate cancer Father   .  Skin cancer Father   . Stroke Father   . Seizures Father   . Epilepsy Brother   . Prostate cancer Brother   . Cancer Maternal Aunt        colonoscopy    Social History   Socioeconomic History  . Marital status: Divorced    Spouse name: Not on file  . Number of children: 2  . Years of education: Not on file  . Highest education level: Not on file  Occupational History  . Occupation: unemployed  Social Needs  . Financial resource strain: Not on file  . Food insecurity:    Worry: Not on file    Inability: Not on file  . Transportation needs:    Medical: Not on file    Non-medical: Not on file  Tobacco Use  . Smoking status: Former Smoker    Packs/day: 0.75    Years:  40.00    Pack years: 30.00    Last attempt to quit: 03/11/2015    Years since quitting: 3.1  . Smokeless tobacco: Never Used  Substance and Sexual Activity  . Alcohol use: No  . Drug use: No  . Sexual activity: Not on file  Lifestyle  . Physical activity:    Days per week: Not on file    Minutes per session: Not on file  . Stress: Not on file  Relationships  . Social connections:    Talks on phone: Not on file    Gets together: Not on file    Attends religious service: Not on file    Active member of club or organization: Not on file    Attends meetings of clubs or organizations: Not on file    Relationship status: Not on file  . Intimate partner violence:    Fear of current or ex partner: Not on file    Emotionally abused: Not on file    Physically abused: Not on file    Forced sexual activity: Not on file  Other Topics Concern  . Not on file  Social History Narrative  . Not on file    Past Medical History, Surgical history, Social history, and Family history were reviewed and updated as appropriate.   Please see review of systems for further details on the patient's review from today.   Review of Systems:  Review of Systems  Constitutional: Positive for chills and fever. Negative for appetite change and diaphoresis.  HENT: Negative for postnasal drip, rhinorrhea, sinus pressure and sinus pain.   Respiratory: Negative for cough, choking, shortness of breath and wheezing.   Cardiovascular: Negative for chest pain and palpitations.  Gastrointestinal: Positive for abdominal pain, nausea and vomiting. Negative for constipation and diarrhea.  Genitourinary: Negative for decreased urine volume.  Musculoskeletal: Positive for arthralgias, gait problem and joint swelling.  Neurological: Negative for headaches.    Objective:   Physical Exam:  BP 137/69 (BP Location: Right Arm, Patient Position: Sitting)   Pulse 93   Temp 99.4 F (37.4 C) (Oral)   Resp 18   Ht 5\' 10"   (1.778 m)   Wt 181 lb 4.8 oz (82.2 kg)   LMP  (LMP Unknown)   SpO2 97%   BMI 26.01 kg/m  ECOG: 1  Physical Exam  Constitutional: No distress.  HENT:  Head: Normocephalic and atraumatic.  Eyes: Right eye exhibits no discharge. Left eye exhibits no discharge. No scleral icterus.  Cardiovascular: Normal rate, regular rhythm and normal heart sounds. Exam reveals no gallop and no friction  rub.  No murmur heard. Pulmonary/Chest: Effort normal and breath sounds normal. No respiratory distress. She has no wheezes. She has no rales.  Abdominal: Soft. Bowel sounds are normal. She exhibits no distension. There is no tenderness. There is no guarding.  Musculoskeletal: She exhibits edema (Left knee is swollen and tender.) and tenderness (Left knee is swollen and tender.).  She has a dressing and an orthotic shoe on her right foot.  Neurological: She is alert. Coordination (The patient is ambulating with use of a wheelchair today.) abnormal.  Skin: Skin is warm and dry. No rash noted. She is not diaphoretic. No erythema.  Psychiatric: She has a normal mood and affect. Her behavior is normal. Judgment and thought content normal.    Lab Review:     Component Value Date/Time   NA 136 03/02/2018 0940   K 4.2 03/02/2018 0940   CL 102 03/02/2018 0940   CO2 24 03/02/2018 0940   GLUCOSE 367 (H) 03/02/2018 0940   BUN 20 03/02/2018 0940   CREATININE 1.22 (H) 03/02/2018 0940   CALCIUM 9.1 03/02/2018 0940   PROT 7.5 03/02/2018 0940   ALBUMIN 3.3 (L) 03/02/2018 0940   AST 11 (L) 03/02/2018 0940   ALT 9 03/02/2018 0940   ALKPHOS 108 03/02/2018 0940   BILITOT 0.4 03/02/2018 0940   GFRNONAA 47 (L) 03/02/2018 0940   GFRAA 54 (L) 03/02/2018 0940       Component Value Date/Time   WBC 7.6 04/19/2018 1319   RBC 3.58 (L) 04/19/2018 1319   HGB 11.0 (L) 04/19/2018 1319   HCT 33.1 (L) 04/19/2018 1319   PLT 212 04/19/2018 1319   MCV 92.4 04/19/2018 1319   MCH 30.7 04/19/2018 1319   MCHC 33.2 04/19/2018  1319   RDW 16.9 (H) 04/19/2018 1319   LYMPHSABS 1.4 04/19/2018 1319   MONOABS 0.7 04/19/2018 1319   EOSABS 0.1 04/19/2018 1319   BASOSABS 0.0 04/19/2018 1319   -------------------------------  Imaging from last 24 hours (if applicable):  Radiology interpretation: Dg Knee 1-2 Views Left  Result Date: 04/19/2018 CLINICAL DATA:  Acute onset of LEFT knee pain that began 3 days ago, associated with fever. Personal history of ANTERIOR cruciate ligament tear. No recent injuries. EXAM: LEFT KNEE - 1-2 VIEW COMPARISON:  None. FINDINGS: No evidence of acute, subacute or healed fractures. Well-preserved joint spaces. No intrinsic osseous abnormality. Large joint effusion. IMPRESSION: No osseous abnormality.  Large joint effusion. Electronically Signed   By: Evangeline Dakin M.D.   On: 04/19/2018 15:45

## 2018-04-21 NOTE — Telephone Encounter (Signed)
I was called by the answering service regarding the pain that Brenda Moore is having in her left knee.  She was seen in a couple days ago in the oncology clinic.  She had an x-ray of the left knee which showed a large joint effusion.  She was given some pain medication.  I think she was given some hydrocodone.  She says that the pain is no better.  I do not feel comfortable giving her stronger pain medication since this is a problem that is not related to her endometrial cancer as far as I can tell.  I had the answering service instruct her to go to the emergency room.  There, and a orthopedist or the ER doctor can see her and drained the knee.  The fluid can then be tested.  I feel bad that she has metastatic endometrial cancer.  However, I do not see that this issue is anything related to her disease or to any treatment.  She is not on treatment right now.  The answering service said that they will convey my recommendation to [name].  I will be more than happy to see her if she is admitted.  Lattie Haw, MD

## 2018-05-03 ENCOUNTER — Telehealth: Payer: Self-pay | Admitting: *Deleted

## 2018-05-03 NOTE — Telephone Encounter (Signed)
Received voice mail message from patient stating,"this is Brenda Moore and I need a refill on my Hydrocodone for my knee pain. Return number is [phone]."

## 2018-05-04 NOTE — Telephone Encounter (Signed)
Brenda Moore/Brenda Moore, please call the patient. What is her plan? Without chemo, her cancer will progress. We discussed hospice before but she declined Her leg/knee pain & DM are unrelated to her cancer and we have discussed primary care doctor follow-up If she declined hospice, her PCP will have to manage her chronic pain

## 2018-05-04 NOTE — Telephone Encounter (Signed)
Called and given below message. She verbalized understanding. She declined to discuss the message and a referral to hospice. Ask is she wanted appt with Dr. Alvy Bimler to discuss. She declined. She states she will call her PCP regarding pain medication.

## 2018-05-05 ENCOUNTER — Telehealth: Payer: Self-pay | Admitting: Oncology

## 2018-05-05 NOTE — Telephone Encounter (Signed)
Brenda Moore called and said she is interested in a palliative care referral.  She is not ready for a hospice referral but thinks that palliative care may help her.  She is interested in help with pain control/medication.  She is currently talking baclofen but it is making her sleepy during the day.  Discussed her hesitation with a hospice referral and she said she had hospice before for her foot wound and did not like it.  Advised her that Dr. Alvy Bimler will be notified about the palliative care referral and that we will call her back.

## 2018-05-05 NOTE — Telephone Encounter (Signed)
Called in referral to [facility] and left a message for patient notifying her of referral and that she will still need a PCP to manage her chronic pain.

## 2018-05-05 NOTE — Telephone Encounter (Signed)
pls refer to [facility] She will still need primary care doctor to manage her chronic pain

## 2018-05-15 ENCOUNTER — Telehealth: Payer: Self-pay

## 2018-05-15 NOTE — Telephone Encounter (Signed)
Called to clarify after hours call on 10/20. She would like a referral to Hospice. Instructed her to call the office if needed. She verbalized understanding.  Hospice referral called to [place]. Manus Gunning will call Ms. Pepper.

## 2018-05-18 ENCOUNTER — Other Ambulatory Visit: Payer: Self-pay | Admitting: Hematology and Oncology

## 2018-05-18 ENCOUNTER — Telehealth: Payer: Self-pay | Admitting: Oncology

## 2018-05-18 NOTE — Telephone Encounter (Signed)
Left a message for Brenda Moore to see if Hospice has contacted her.  Requested a return call.

## 2018-05-26 DEATH — deceased

## 2018-11-09 ENCOUNTER — Encounter: Payer: Self-pay | Admitting: Hematology and Oncology

## 2020-01-24 IMAGING — CT CT CHEST W/ CM
2 of 5 series · 13 of 36 positions shown, 16 images · IV contrast (OMNIPAQUE)
Comparison: CT abdomen/pelvis 01/10/2018

CLINICAL DATA: Metastatic endometrial cancer status post 3 sessions
of chemotherapy.

EXAM:
CT CHEST, ABDOMEN, AND PELVIS WITH CONTRAST
TECHNIQUE: Multidetector CT imaging of the chest, abdomen and pelvis was
performed following the standard protocol during bolus
administration of intravenous contrast.
CONTRAST:  100mL OMNIPAQUE IOHEXOL 300 MG/ML  SOLN

[Series 2: cap with · axial · 0.83mm/px · z∈[+1248,+1793]mm · 10 of 131 slices shown, 13 images]
[im 11/131  mediastinal]
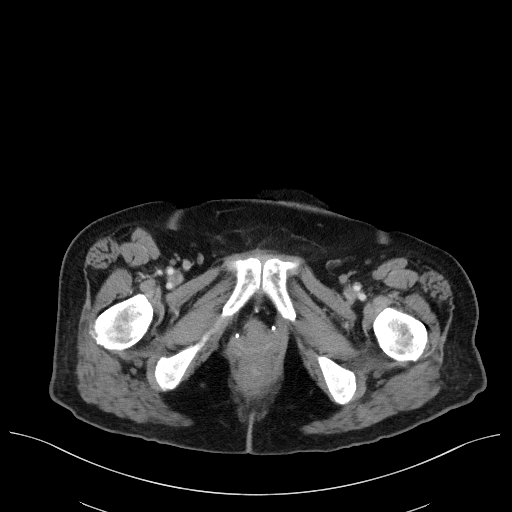
[im 11/131  lung]
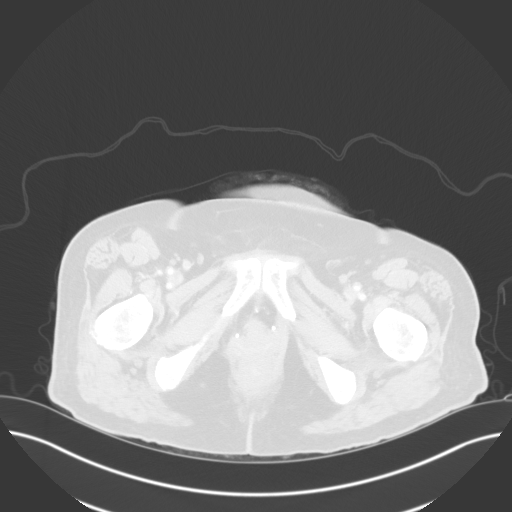
[im 22/131  lung]
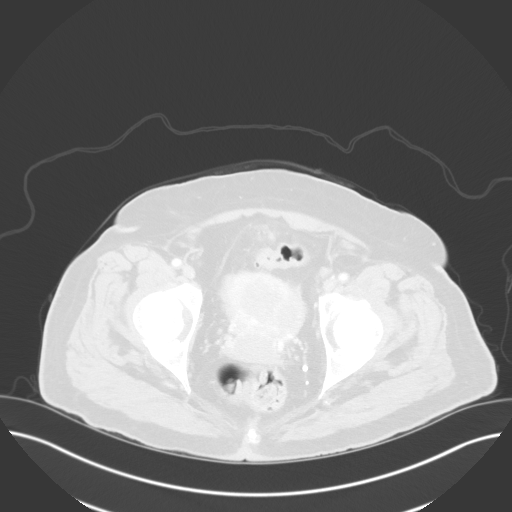
[im 33/131  lung]
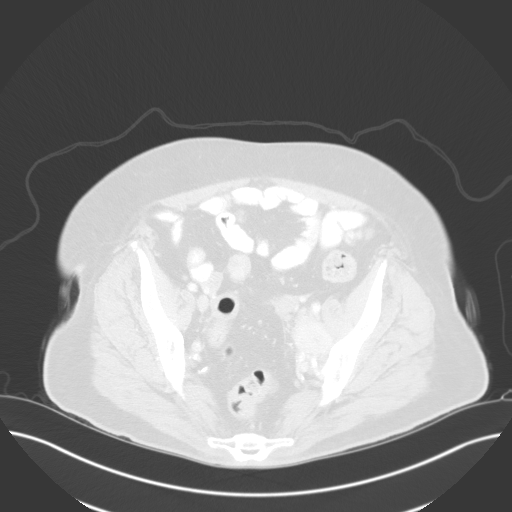
[im 44/131  lung]
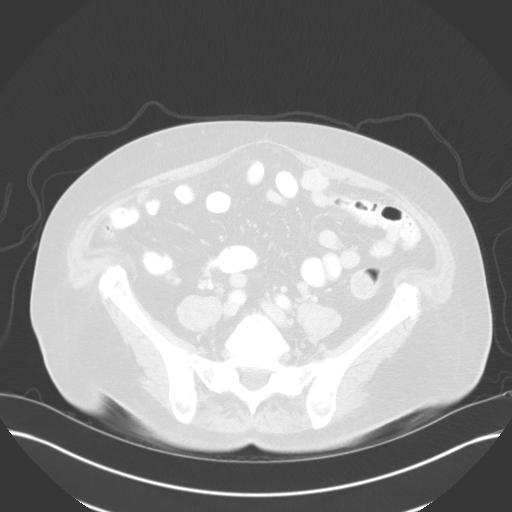
[im 55/131  mediastinal]
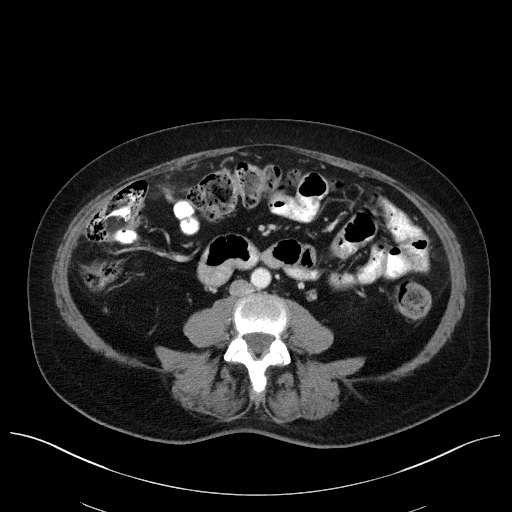
[im 55/131  lung]
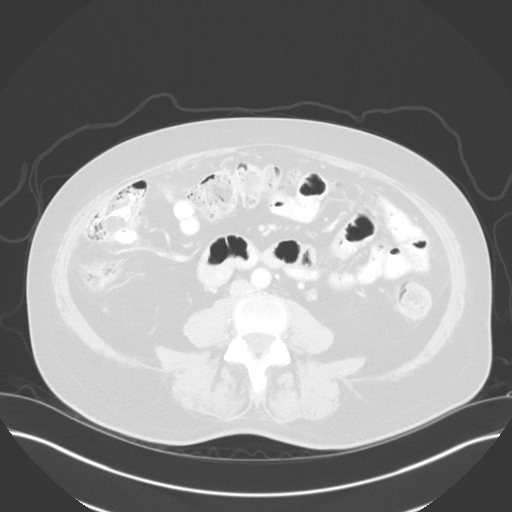
[im 76/131  lung]
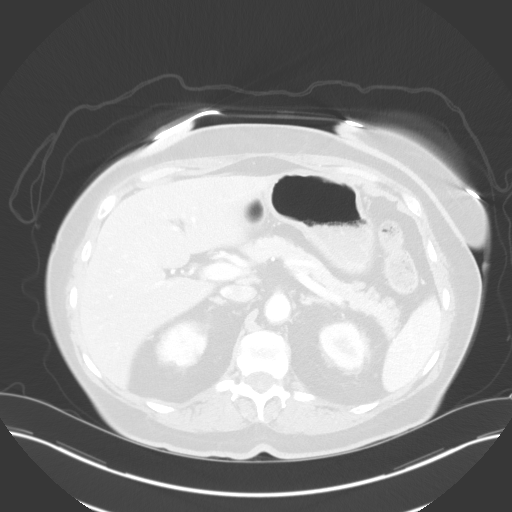
[im 87/131  lung]
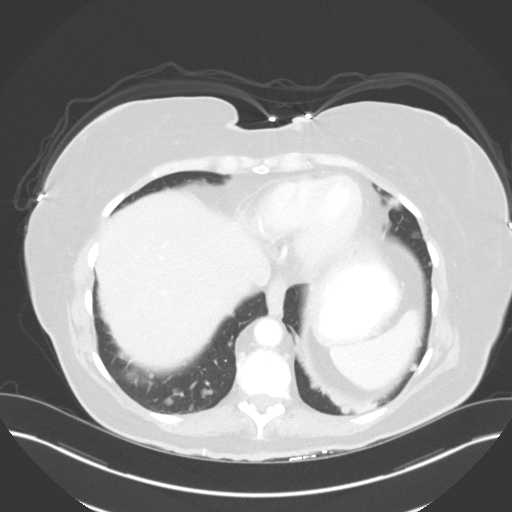
[im 98/131  lung]
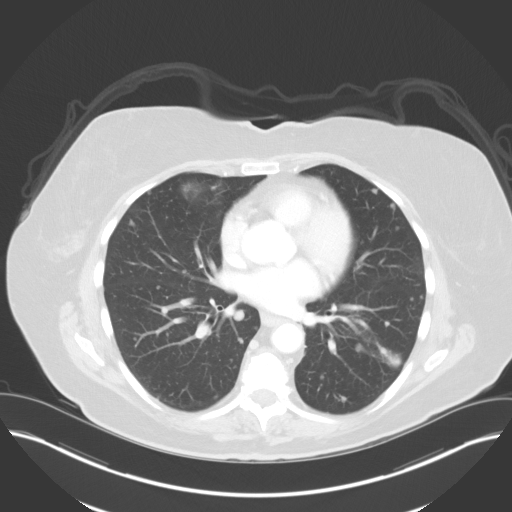
[im 109/131  mediastinal]
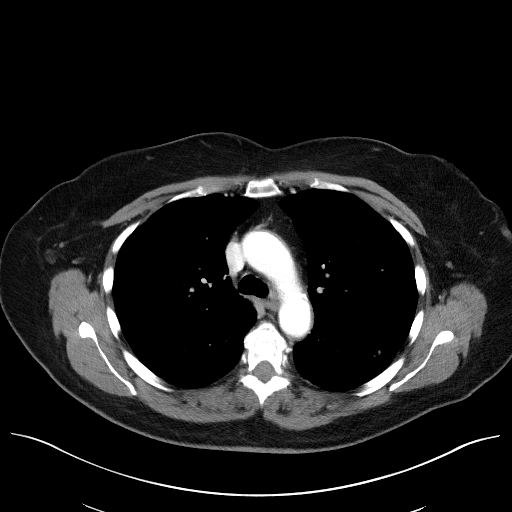
[im 109/131  lung]
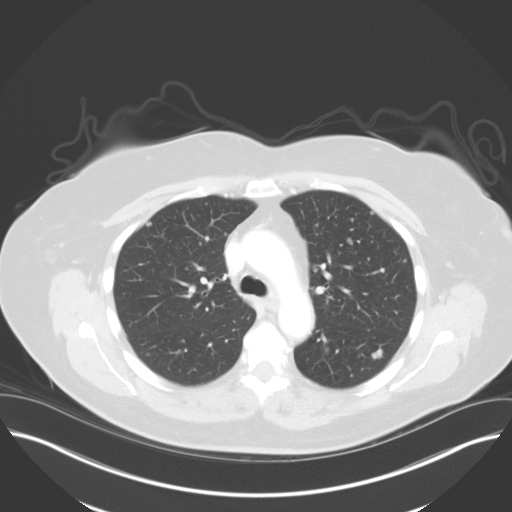
[im 120/131  lung]
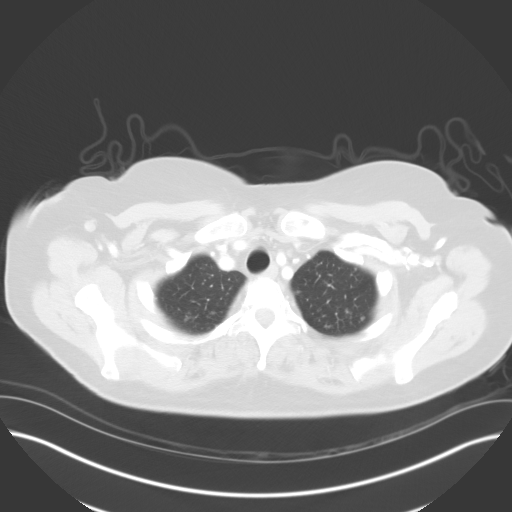

[Series 4: coronals · coronal · 1.01mm/px · 3 of 136 slices shown]
[im 28/136  lung]
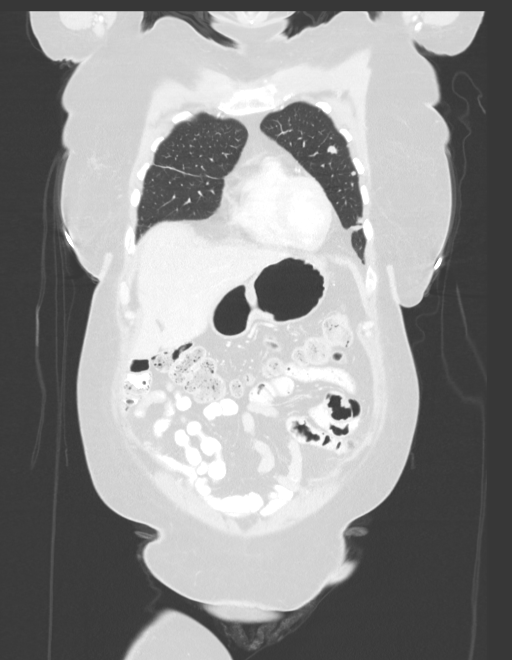
[im 55/136  lung]
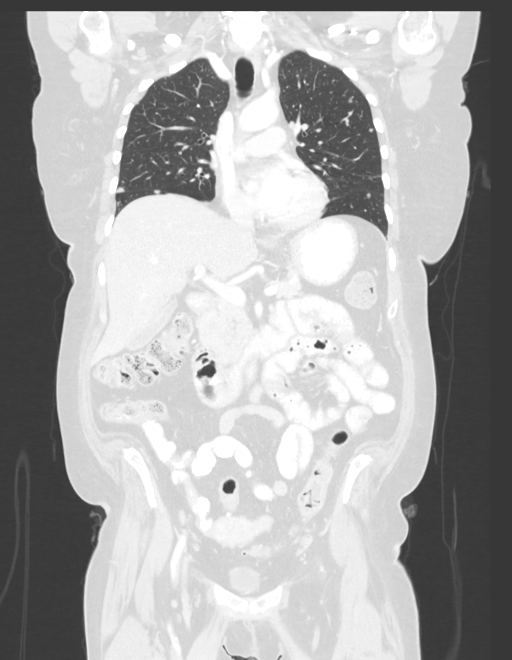
[im 82/136  lung]
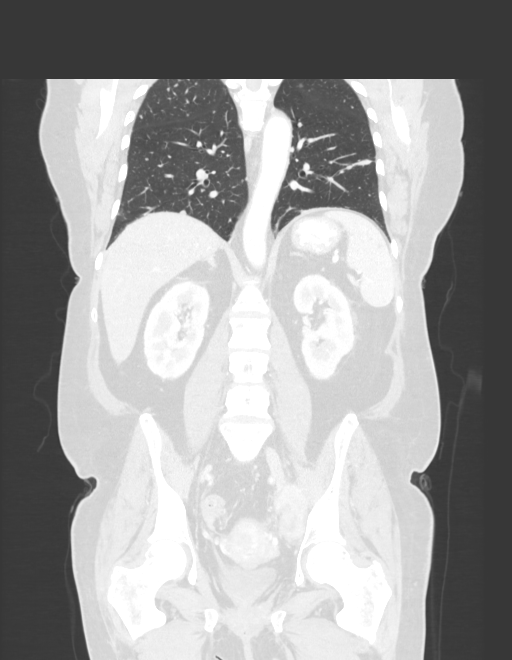

[13 of 36 positions shown; findings below may reference images not displayed]

FINDINGS: CT CHEST FINDINGS

Cardiovascular: The heart is normal in size. No pericardial
effusion. The aorta is normal in caliber. No aneurysm or dissection.
Scattered atherosclerotic calcifications. The branch vessels are
patent. Scattered coronary artery calcifications.

Mediastinum/Nodes: Small scattered mediastinal and hilar lymph nodes
but no mass or overt adenopathy. The esophagus is grossly normal.

Lungs/Pleura: Diffuse pulmonary metastatic disease.

Index lesion in the right upper lobe on image number 51 measures
18.5 x 16 mm. This was not covered on the prior abdominal CT scan.

Index lesion in the left lower lobe measures 11 x 8.5 mm on image
number 89. This appears stable.

9 x 9 mm index lesion in the right lower lobe on image number 95.
This appears stable.

Chest wall/musculoskeletal: No breast masses, supraclavicular or
axillary adenopathy. The thyroid gland appears normal.

Indeterminate lesion in the T7 vertebral body. No other definite
bone lesions. Degenerative changes involving the spine.

CT ABDOMEN PELVIS FINDINGS

Hepatobiliary: 14 mm peritoneal surface lesion involving the
anterior aspect of the liver on image number 59 is unchanged. The
segment 6 lesion measures 4.2 x 3.0 cm and previously measured 3.5 x
2.4 cm.

19 mm segment 3 lesion on image number 62 was definitely present on
the prior study but was subtle and difficult to measure.

Pancreas: No mass, inflammation or ductal dilatation.

Spleen: Normal size.  No focal lesions.

Adrenals/Urinary Tract: The adrenal glands and kidneys are
unremarkable and stable. The bladder is grossly normal.

Stomach/Bowel: The stomach, duodenum, small bowel and colon are
unremarkable. No acute inflammatory changes, mass lesions or
obstructive findings. Sigmoid colon diverticulosis without findings
for acute diverticulitis.

Vascular/Lymphatic: Numerous omental metastatic implants are again
demonstrated.

20 x 14.5 mm omental lesion on image number 74 on the left side
previously measured 12.5 x 12 mm.

10 mm omental lesion near the midline on image number 75 previously
measured 9 mm.

12 mm right omental lesion on image number 76 previously measured 16
mm.

13.5 mm right-sided omental lesion on image number 81 previously
measured 11 mm.

20.5 x 18.5 mm left lower omental lesion on image number 88
previously measured 19.5 x 14.5 mm.

Reproductive: Stable appearing endometrial tumor measuring
approximately 6.3 x 5.5 cm on image number 106. Previously measured
6.2 x 5.3 cm.

Bulky left pelvic sidewall necrotic adenopathy is again
demonstrated. The upper portion has a maximum transverse diameter of
32 mm and this was previously 33.5 mm. The lower more necrotic
appearing portion measures 40 x 23 mm and previously measured 52 x
32 mm. Stable appearing bilateral external iliac artery lymph nodes.

Other: No inguinal mass or hernia. 2.5 cm right inguinal lymph node
appears more solid and is worrisome for metastatic disease.

Musculoskeletal: No obvious bone lesions or acute bony findings.
IMPRESSION: 1. Numerous bilateral pulmonary metastatic nodules. The 2 lower lobe
nodules appear relatively stable.
2. Slightly progressive hepatic lesions as detailed above.
3. Most of the omental lesions are slightly larger.
4. Stable endometrial mass.
5. Slight decrease and bulky left pelvic sidewall adenopathy.
6. More solid appearing 2.5 cm right inguinal lymph node could be
metastatic disease.
